# Patient Record
Sex: Female | Born: 1985 | Race: Black or African American | Hispanic: No | Marital: Single | Smoking: Never smoker
Health system: Southern US, Community
[De-identification: ages and names within clinical notes are randomized; demographics above are authoritative.]

## PROBLEM LIST (undated history)

## (undated) DIAGNOSIS — G43909 Migraine, unspecified, not intractable, without status migrainosus: Secondary | ICD-10-CM

---
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
## 2005-01-17 ENCOUNTER — Ambulatory Visit (HOSPITAL_COMMUNITY): Admission: RE | Admit: 2005-01-17 | Discharge: 2005-01-17 | Payer: Self-pay | Admitting: *Deleted

## 2005-05-29 ENCOUNTER — Inpatient Hospital Stay (HOSPITAL_COMMUNITY): Admission: AD | Admit: 2005-05-29 | Discharge: 2005-05-31 | Payer: Self-pay | Admitting: Gynecology

## 2005-05-29 ENCOUNTER — Ambulatory Visit: Payer: Self-pay | Admitting: Obstetrics and Gynecology

## 2006-12-15 IMAGING — US US OB COMP +14 WK
1 series · 13 of 28 positions shown · non-contrast
Comparison: none

CLINICAL DATA: 20 week 0 day assigned gestational age.  Evaluate fetal anatomy and growth.

[Series 1: us ob comp +14 wk · 0.27mm/px · 13 of 90 slices shown]
[im 4/90]
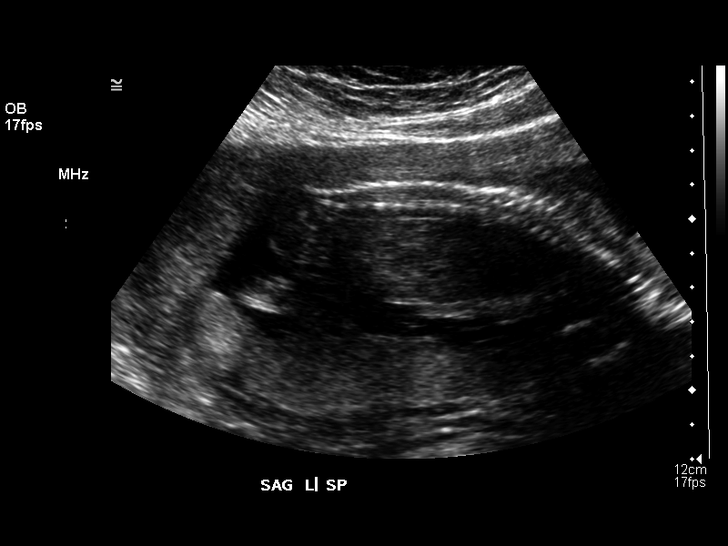
[im 10/90]
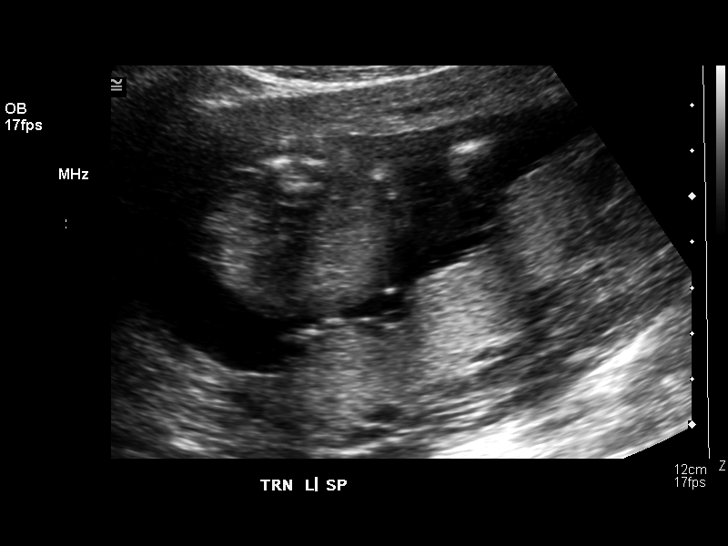
[im 17/90]
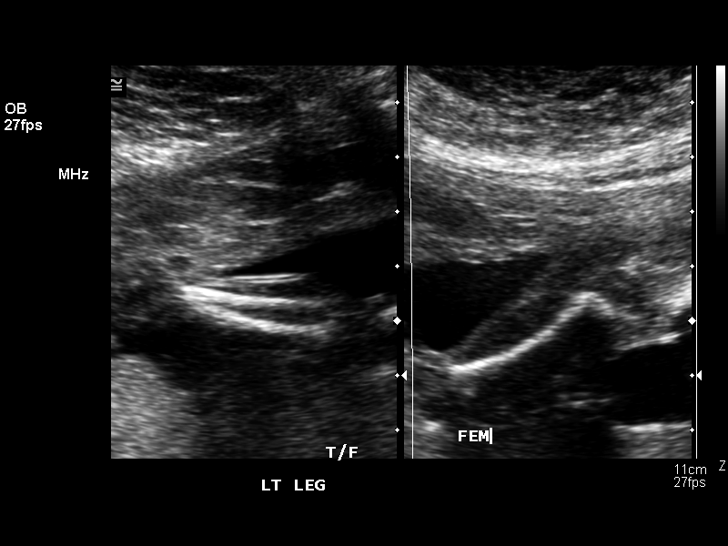
[im 24/90]
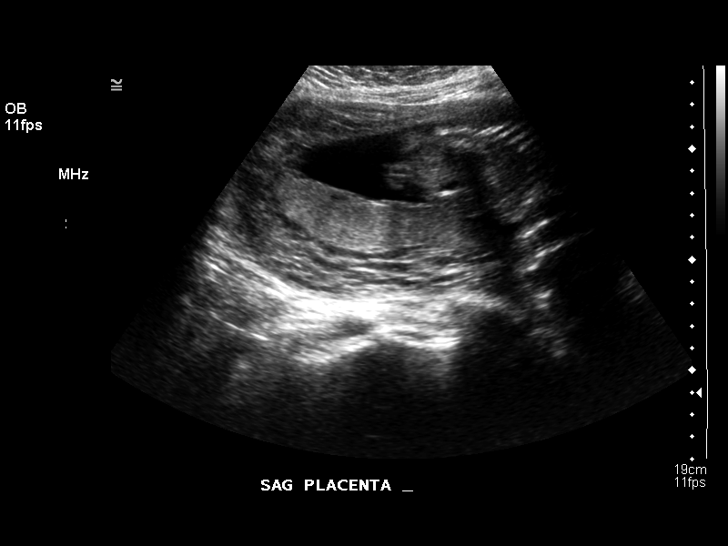
[im 30/90]
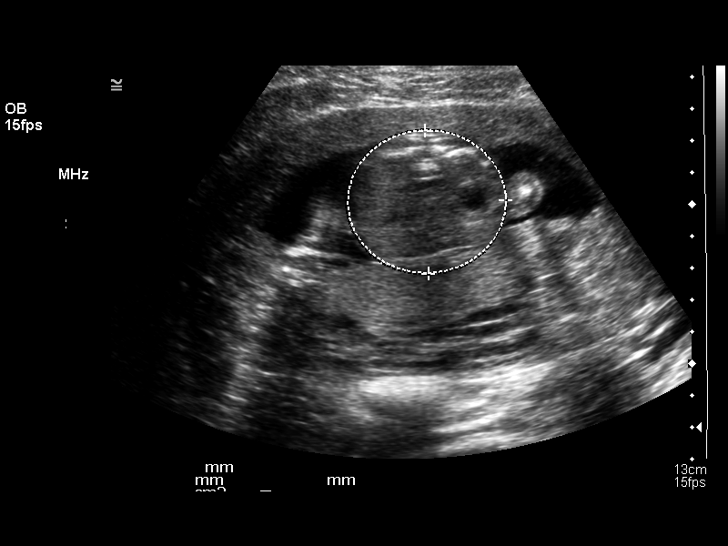
[im 37/90]
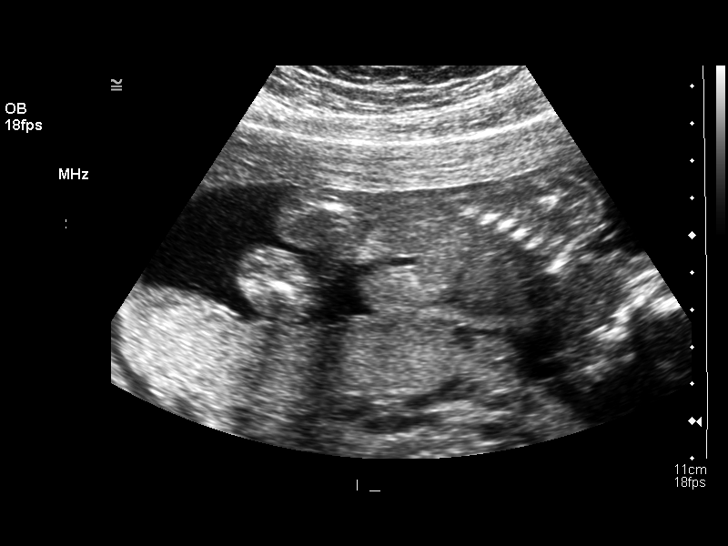
[im 47/90]
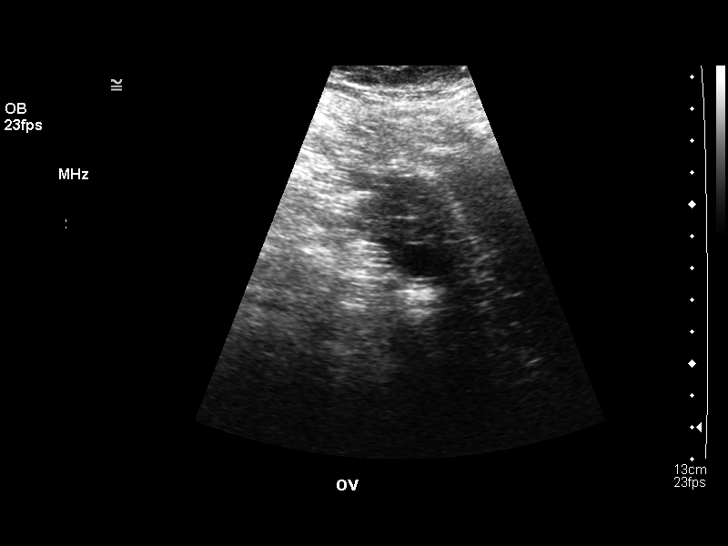
[im 53/90]
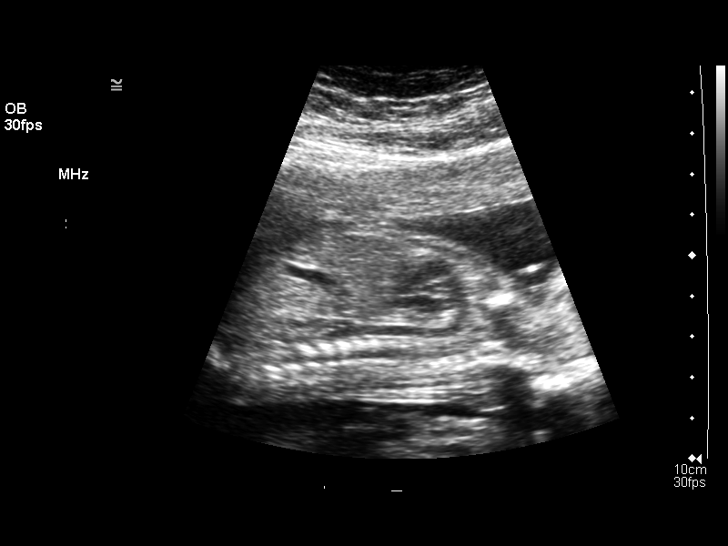
[im 60/90]
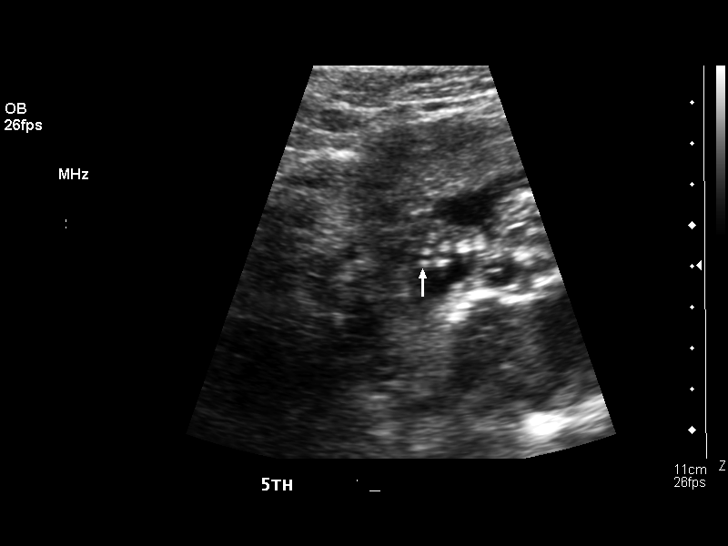
[im 66/90]
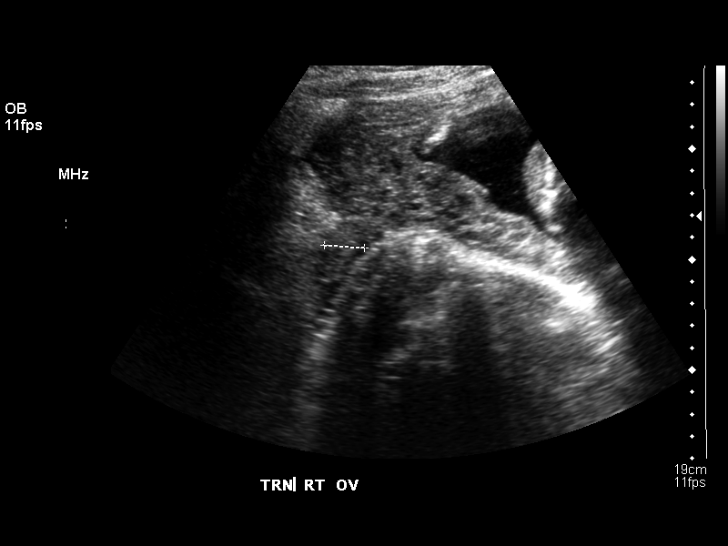
[im 73/90]
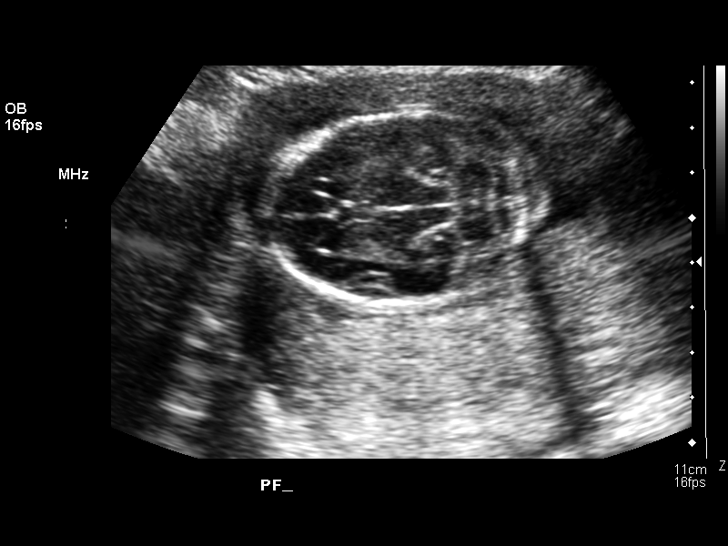
[im 80/90]
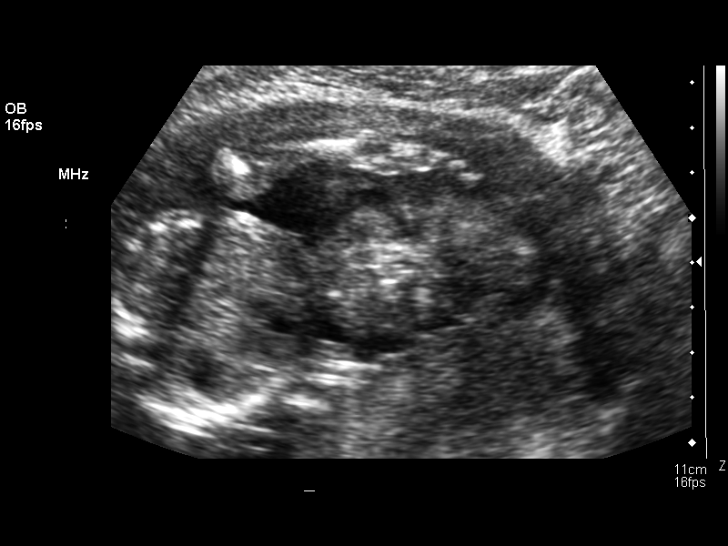
[im 86/90]
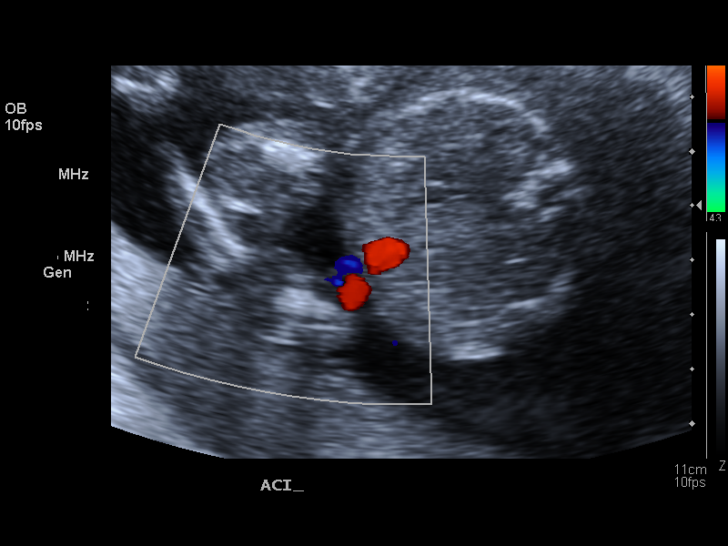

[13 of 28 positions shown; findings below may reference images not displayed]

OBSTETRICAL ULTRASOUND:
Number of Fetuses:  1
Heart Rate:  133 bpm
Movement:  Yes
Breathing:  No
Presentation:  Cephalic
Placental Location:  Posterior
Grade:  1
Previa:  No
Amniotic Fluid (Subjective):  Normal
Amniotic Fluid (Objective):   Vertical pocket 4.8 cm

FETAL BIOMETRY
BPD:  4.3 cm  19 w  0 d
HC:  17.1 cm  19 w 5 d
AC:  14.7 cm  20 w  0 d
FL:  3.4 cm  20 w 5 d

MEAN GA:   19 w  6 d

FETAL ANATOMY
Lateral Ventricles:  Visualized 
Thalami/CSP:  Visualized 
Posterior Fossa:  Visualized 
Nuchal Region:  Visualized 
Spine:  Visualized 
4 Chamber Heart on Left:  Visualized 
Stomach on Left:  Visualized 
3 Vessel Cord:  Visualized 
Cord Insertion site:  Visualized 
Kidneys:  Visualized 
Bladder:  Visualized 
Extremities:  Visualized 

ADDITIONAL ANATOMY VISUALIZED:  LVOT, RVOT, upper lip, orbits, diaphragm, heel, 5th digit, ductal arch, aortic arch

Evaluation limited by:  Fetal position

MATERNAL UTERINE AND ADNEXAL FINDINGS
Cervix:   3.1 cm transabdominally

Both ovaries are unremarkable.
IMPRESSION: 1.  Assigned gestational age is currently 20 weeks 0 days.  Appropriate fetal growth.

2.  No evidence of fetal anatomic abnormality.

## 2007-02-25 ENCOUNTER — Emergency Department (HOSPITAL_COMMUNITY): Admission: EM | Admit: 2007-02-25 | Discharge: 2007-02-25 | Payer: Self-pay | Admitting: Emergency Medicine

## 2008-01-04 ENCOUNTER — Emergency Department (HOSPITAL_COMMUNITY): Admission: EM | Admit: 2008-01-04 | Discharge: 2008-01-04 | Payer: Self-pay | Admitting: Family Medicine

## 2010-11-14 LAB — GC/CHLAMYDIA PROBE AMP, GENITAL
Chlamydia, DNA Probe: NEGATIVE
GC Probe Amp, Genital: NEGATIVE

## 2010-11-14 LAB — RPR: RPR Ser Ql: NONREACTIVE

## 2010-11-14 LAB — WET PREP, GENITAL
Trich, Wet Prep: NONE SEEN
Yeast Wet Prep HPF POC: NONE SEEN

## 2010-11-14 LAB — PREGNANCY, URINE: Preg Test, Ur: NEGATIVE

## 2010-11-14 LAB — URIC ACID: Uric Acid, Serum: 4.2

## 2011-03-03 ENCOUNTER — Emergency Department (HOSPITAL_COMMUNITY)
Admission: EM | Admit: 2011-03-03 | Discharge: 2011-03-03 | Disposition: A | Discharge status: Home / Self Care | Payer: Self-pay | Attending: Emergency Medicine | Admitting: Emergency Medicine

## 2011-03-03 ENCOUNTER — Encounter: Payer: Self-pay | Admitting: Emergency Medicine

## 2011-03-03 DIAGNOSIS — B9689 Other specified bacterial agents as the cause of diseases classified elsewhere: Secondary | ICD-10-CM | POA: Insufficient documentation

## 2011-03-03 DIAGNOSIS — R10813 Right lower quadrant abdominal tenderness: Secondary | ICD-10-CM | POA: Insufficient documentation

## 2011-03-03 DIAGNOSIS — N390 Urinary tract infection, site not specified: Secondary | ICD-10-CM | POA: Insufficient documentation

## 2011-03-03 DIAGNOSIS — Z79899 Other long term (current) drug therapy: Secondary | ICD-10-CM | POA: Insufficient documentation

## 2011-03-03 DIAGNOSIS — A499 Bacterial infection, unspecified: Secondary | ICD-10-CM | POA: Insufficient documentation

## 2011-03-03 DIAGNOSIS — M79609 Pain in unspecified limb: Secondary | ICD-10-CM | POA: Insufficient documentation

## 2011-03-03 DIAGNOSIS — N760 Acute vaginitis: Secondary | ICD-10-CM | POA: Insufficient documentation

## 2011-03-03 DIAGNOSIS — R1031 Right lower quadrant pain: Secondary | ICD-10-CM | POA: Insufficient documentation

## 2011-03-03 LAB — URINALYSIS, ROUTINE W REFLEX MICROSCOPIC
Glucose, UA: NEGATIVE mg/dL
Hgb urine dipstick: NEGATIVE
Ketones, ur: NEGATIVE mg/dL
Nitrite: POSITIVE — AB
Protein, ur: NEGATIVE mg/dL
Specific Gravity, Urine: 1.034 — ABNORMAL HIGH (ref 1.005–1.030)
Urobilinogen, UA: 1 mg/dL (ref 0.0–1.0)

## 2011-03-03 LAB — POCT PREGNANCY, URINE: Preg Test, Ur: NEGATIVE

## 2011-03-03 LAB — WET PREP, GENITAL: Yeast Wet Prep HPF POC: NONE SEEN

## 2011-03-03 LAB — URINE MICROSCOPIC-ADD ON

## 2011-03-03 MED ORDER — CIPROFLOXACIN HCL 500 MG PO TABS: 500.0000 mg | ORAL_TABLET | Freq: Two times a day (BID) | ORAL | Status: AC

## 2011-03-03 MED ORDER — METRONIDAZOLE 500 MG PO TABS: 500.0000 mg | ORAL_TABLET | Freq: Two times a day (BID) | ORAL | Status: AC

## 2011-03-03 MED ORDER — OXYCODONE-ACETAMINOPHEN 5-325 MG PO TABS
1.0000 | ORAL_TABLET | Freq: Once | ORAL | Status: AC
  Administered 2011-03-03: 1 via ORAL

## 2011-03-03 MED ORDER — OXYCODONE-ACETAMINOPHEN 5-325 MG PO TABS: 2.0000 | ORAL_TABLET | ORAL | Status: AC | PRN

## 2011-03-03 NOTE — ED Notes (Signed)
Pt c/o right sided groin area pain x 7 months that started radiating down right leg several weeks ago; pt sts some pain in left groin also

## 2011-03-03 NOTE — ED Notes (Signed)
Pt states that she has had rt side lower abd pain x 6-7 months and for the last week or so pain has raditaed down her leg hurts worse at night and durning the day the pain down her leg gets better but she still feels pain in abd denies vag d/c dysuira n/v/ or ovarian cysts

## 2011-03-03 NOTE — ED Provider Notes (Signed)
History     CSN: [id number]  Arrival date & time 03/03/11  0945   First MD Initiated Contact with Patient 03/03/11 1025      Chief Complaint  Patient presents with  . Abdominal Pain    (Consider location/radiation/quality/duration/timing/severity/associated sxs/prior treatment) HPI  Pt presents to the ED with complaints of RLQ abdominal pain for 6 months that has been constant. It has also been associated with intermittent right leg pains that are shooting in quality. The patient denies anything making the pain better or worse. Tylenol and Ibuprofen do not help, that pain doesn't change with position of movement. The patient has a Mirena in place for birth control. The patient denies urinary/vaginal symptoms, fevers, diarrhea, constipation, vomiting.  History reviewed. No pertinent past medical history.  History reviewed. No pertinent past surgical history.  History reviewed. No pertinent family history.  History  Substance Use Topics  . Smoking status: Never Smoker   . Smokeless tobacco: Not on file  . Alcohol Use: No    OB History    Grav Para Term Preterm Abortions TAB SAB Ect Mult Living                  Review of Systems  All other systems reviewed and are negative.    Allergies  Review of patient's allergies indicates no known allergies.  Home Medications   Current Outpatient Rx  Name Route Sig Dispense Refill  . RANITIDINE HCL 300 MG PO TABS Oral Take 300 mg by mouth at bedtime.      Marland Kitchen CIPROFLOXACIN HCL 500 MG PO TABS Oral Take 1 tablet (500 mg total) by mouth 2 (two) times daily. 28 tablet 0  . METRONIDAZOLE 500 MG PO TABS Oral Take 1 tablet (500 mg total) by mouth 2 (two) times daily. 14 tablet 0  . OXYCODONE-ACETAMINOPHEN 5-325 MG PO TABS Oral Take 2 tablets by mouth every 4 (four) hours as needed for pain. 6 tablet 0    BP 118/64  Pulse 79  Temp(Src) 98.2 F (36.8 C) (Oral)  Resp 18  SpO2 99%  Physical Exam  Nursing note and vitals  reviewed. Constitutional: She is oriented to person, place, and time. She appears well-developed and well-nourished.  HENT:  Head: Normocephalic and atraumatic.  Eyes: Conjunctivae are normal. Pupils are equal, round, and reactive to light.  Neck: Trachea normal, normal range of motion and full passive range of motion without pain. Neck supple.  Cardiovascular: Normal rate, regular rhythm and normal pulses.   Pulmonary/Chest: Effort normal and breath sounds normal. Chest wall is not dull to percussion. She exhibits no tenderness, no crepitus, no edema, no deformity and no retraction.  Abdominal: Soft. Normal appearance and bowel sounds are normal. She exhibits no distension and no mass. There is tenderness (RLQ tenderness to palpation). There is no rebound and no guarding.  Genitourinary: Uterus normal. Cervix exhibits no motion tenderness, no discharge and no friability. Right adnexum displays no mass, no tenderness and no fullness. Left adnexum displays no mass, no tenderness and no fullness.  Musculoskeletal: Normal range of motion.  Lymphadenopathy:       Head (right side): No submental, no submandibular, no tonsillar, no preauricular, no posterior auricular and no occipital adenopathy present.       Head (left side): No submental, no submandibular, no tonsillar, no preauricular, no posterior auricular and no occipital adenopathy present.    She has no cervical adenopathy.    She has no axillary adenopathy.  Neurological: She  is oriented to person, place, and time. She has normal strength.  Skin: Skin is warm, dry and intact.  Psychiatric: Her speech is normal. Cognition and memory are normal.    ED Course  Procedures (including critical care time)  Labs Reviewed  URINALYSIS, ROUTINE W REFLEX MICROSCOPIC - Abnormal; Notable for the following:    Specific Gravity, Urine 1.034 (*)    Nitrite POSITIVE (*)    Leukocytes, UA SMALL (*)    All other components within normal limits  WET  PREP, GENITAL - Abnormal; Notable for the following:    Trich, Wet Prep FEW (*)    Clue Cells, Wet Prep FEW (*)    WBC, Wet Prep HPF POC FEW (*)    All other components within normal limits  GC/CHLAMYDIA PROBE AMP, GENITAL - Abnormal; Notable for the following:    Chlamydia, DNA Probe POSITIVE (*)    All other components within normal limits  URINE MICROSCOPIC-ADD ON - Abnormal; Notable for the following:    Bacteria, UA MANY (*)    All other components within normal limits  POCT PREGNANCY, URINE  LAB REPORT - SCANNED  POCT PREGNANCY, URINE   No results found.   1. UTI (lower urinary tract infection)   2. Bacterial vaginosis       MDM          Dorthula Matas, PA 03/06/11 [phone]

## 2011-03-03 NOTE — ED Notes (Signed)
BRP

## 2011-03-05 LAB — GC/CHLAMYDIA PROBE AMP, GENITAL
Chlamydia, DNA Probe: POSITIVE — AB
GC Probe Amp, Genital: NEGATIVE

## 2011-03-06 NOTE — ED Notes (Addendum)
+   Chlamydia -Patient treated with Cipro and  Flagyl-chart sent to EDP  Office for review.

## 2011-03-07 NOTE — ED Notes (Signed)
Please prescribe Doxycyline 100 mg tab po BID x 7 days per Fayrene Helper.

## 2011-03-07 NOTE — ED Provider Notes (Signed)
Medical screening examination/treatment/procedure(s) were performed by non-physician practitioner and as supervising physician I was immediately available for consultation/collaboration.   Gerhard Munch, MD 03/07/11 [phone]

## 2011-03-11 NOTE — ED Notes (Signed)
Unable to contact via phone letter sent to EDP office for review.

## 2011-04-08 NOTE — ED Notes (Signed)
No response after 30 days . Chart appended and sent to Medical records. 

## 2011-04-16 NOTE — ED Notes (Signed)
Patient called and rx called to [facility] on [address].

## 2011-09-07 ENCOUNTER — Emergency Department (HOSPITAL_COMMUNITY)
Admission: EM | Admit: 2011-09-07 | Discharge: 2011-09-07 | Disposition: A | Discharge status: Home / Self Care | Payer: Self-pay | Attending: Emergency Medicine | Admitting: Emergency Medicine

## 2011-09-07 ENCOUNTER — Encounter (HOSPITAL_COMMUNITY): Payer: Self-pay | Admitting: Emergency Medicine

## 2011-09-07 DIAGNOSIS — L0291 Cutaneous abscess, unspecified: Secondary | ICD-10-CM

## 2011-09-07 DIAGNOSIS — L0231 Cutaneous abscess of buttock: Secondary | ICD-10-CM | POA: Insufficient documentation

## 2011-09-07 MED ORDER — SULFAMETHOXAZOLE-TRIMETHOPRIM 800-160 MG PO TABS: 1.0000 | ORAL_TABLET | Freq: Two times a day (BID) | ORAL | Status: AC

## 2011-09-07 NOTE — ED Provider Notes (Signed)
History     CSN: [id number]  Arrival date & time 09/07/11  1919   First MD Initiated Contact with Patient 09/07/11 2010      Chief Complaint  Patient presents with  . Insect Bite    (Consider location/radiation/quality/duration/timing/severity/associated sxs/prior treatment) HPI History from patient. 26 year old female presents with possible insect bite to her left buttock. She states that she was at work on Thursday when she felt as if she got bitten by something on her buttock. She gradually noticed increased pain to the area and spreading redness. She has not noticed any drainage to the same. She has no history of abscesses. Denies fever or chills. She took Tylenol without relief of her pain at home prior to coming.  History reviewed. No pertinent past medical history.  History reviewed. No pertinent past surgical history.  History reviewed. No pertinent family history.  History  Substance Use Topics  . Smoking status: Never Smoker   . Smokeless tobacco: Not on file  . Alcohol Use: No    OB History    Grav Para Term Preterm Abortions TAB SAB Ect Mult Living                  Review of Systems as per history of present illness  Allergies  Review of patient's allergies indicates no known allergies.  Home Medications   Current Outpatient Rx  Name Route Sig Dispense Refill  . ACETAMINOPHEN 500 MG PO TABS Oral Take 500 mg by mouth every 4 (four) hours as needed. For pain      BP 124/86  Pulse 86  Temp 98.3 F (36.8 C) (Oral)  Resp 18  SpO2 98%  Physical Exam  Nursing note and vitals reviewed. Constitutional: She appears well-developed and well-nourished. No distress.  HENT:  Head: Normocephalic and atraumatic.  Neck: Normal range of motion.  Cardiovascular: Normal rate.   Pulmonary/Chest: Effort normal.  Musculoskeletal: Normal range of motion.  Neurological: She is alert.  Skin: Skin is warm and dry. She is not diaphoretic.       4-5 cm of induration  and mild erythema to left buttock. There is a center area with pointing. No visible drainage at this time.  Psychiatric: She has a normal mood and affect.    ED Course  Procedures (including critical care time)  INCISION AND DRAINAGE Performed by: Grant Fontana Consent: Verbal consent obtained. Risks and benefits: risks, benefits and alternatives were discussed Type: abscess  Body area: Left buttock  Anesthesia: local infiltration  Local anesthetic: lidocaine 2 % with epinephrine  Anesthetic total: 3 ml  Complexity: complex Blunt dissection to break up loculations  Drainage: purulent  Drainage amount: Mild, bloody   Packing material: 1/4 in iodoform gauze  Patient tolerance: Patient tolerated the procedure well with no immediate complications.     Labs Reviewed - No data to display No results found.   1. Cellulitis and abscess       MDM  Patient presents with possible bite to her buttock. This area appears mildly cellulitic with a central area of induration and pointing. Area was incised and drained which was productive primarily of bloody drainage. Patient will be started on Septra to treat cellulitis. Instructed on wound care. Reasons to return discussed. Patient verbalized understanding and agreed to plan.       Grant Fontana, PA-C 09/08/11 0300  Grant Fontana, PA-C 09/08/11 0302

## 2011-09-07 NOTE — ED Notes (Signed)
Patient complaining of spider bite on left buttocks that appears on Thursday; patient reports pain at site; denies itchiness.  Abscess like mark noted on left buttocks; redness around site.  No drainage noted.

## 2011-09-08 NOTE — ED Provider Notes (Signed)
Medical screening examination/treatment/procedure(s) were performed by non-physician practitioner and as supervising physician I was immediately available for consultation/collaboration.  Tylea Hise L Briyana Badman, MD 09/08/11 1827 

## 2012-11-08 ENCOUNTER — Encounter (HOSPITAL_COMMUNITY): Payer: Self-pay | Admitting: *Deleted

## 2012-11-08 ENCOUNTER — Emergency Department (HOSPITAL_COMMUNITY)
Admission: EM | Admit: 2012-11-08 | Discharge: 2012-11-08 | Disposition: A | Discharge status: Home / Self Care | Payer: Medicaid Other | Attending: Emergency Medicine | Admitting: Emergency Medicine

## 2012-11-08 ENCOUNTER — Emergency Department (HOSPITAL_COMMUNITY): Payer: Medicaid Other

## 2012-11-08 DIAGNOSIS — B9689 Other specified bacterial agents as the cause of diseases classified elsewhere: Secondary | ICD-10-CM

## 2012-11-08 DIAGNOSIS — N760 Acute vaginitis: Secondary | ICD-10-CM | POA: Insufficient documentation

## 2012-11-08 DIAGNOSIS — Z3202 Encounter for pregnancy test, result negative: Secondary | ICD-10-CM | POA: Insufficient documentation

## 2012-11-08 DIAGNOSIS — R350 Frequency of micturition: Secondary | ICD-10-CM | POA: Insufficient documentation

## 2012-11-08 LAB — URINALYSIS, ROUTINE W REFLEX MICROSCOPIC
Glucose, UA: NEGATIVE mg/dL
Hgb urine dipstick: NEGATIVE
Ketones, ur: NEGATIVE mg/dL
Nitrite: NEGATIVE
Protein, ur: NEGATIVE mg/dL
Specific Gravity, Urine: 1.038 — ABNORMAL HIGH (ref 1.005–1.030)
Urobilinogen, UA: 1 mg/dL (ref 0.0–1.0)
pH: 5.5 (ref 5.0–8.0)

## 2012-11-08 LAB — WET PREP, GENITAL

## 2012-11-08 LAB — URINE MICROSCOPIC-ADD ON

## 2012-11-08 LAB — PREGNANCY, URINE: Preg Test, Ur: NEGATIVE

## 2012-11-08 MED ORDER — METRONIDAZOLE 500 MG PO TABS: 500.0000 mg | ORAL_TABLET | Freq: Two times a day (BID) | ORAL | Status: DC

## 2012-11-08 NOTE — ED Provider Notes (Signed)
CSN: [id number]     Arrival date & time 11/08/12  0807 History   First MD Initiated Contact with Patient 11/08/12 [phone]     Chief Complaint  Patient presents with  . Abdominal Pain    HPI Pt was seen at 0840.  Per pt, c/o gradual onset and persistence of intermittent right pelvic "pain" for the past 3 months.  Has been associated with dysuria and urinary frequency.  Describes the abd pain as "sharp."  States she was due to have her IUD removed 2 months ago but has not been able to get an appt with her OB/GYN ofc. Denies N/V/D, no hematuria, no vaginal bleeding/discharge, no fevers, no back pain, no rash, no CP/SOB, no black or blood in stools.     [facility] History reviewed. No pertinent past medical history.  History reviewed. No pertinent past surgical history.  History  Substance Use Topics  . Smoking status: Never Smoker   . Smokeless tobacco: Not on file  . Alcohol Use: No    Review of Systems ROS: Statement: All systems negative except as marked or noted in the HPI; Constitutional: Negative for fever and chills. ; ; Eyes: Negative for eye pain, redness and discharge. ; ; ENMT: Negative for ear pain, hoarseness, nasal congestion, sinus pressure and sore throat. ; ; Cardiovascular: Negative for chest pain, palpitations, diaphoresis, dyspnea and peripheral edema. ; ; Respiratory: Negative for cough, wheezing and stridor. ; ; Gastrointestinal: Negative for nausea, vomiting, diarrhea, abdominal pain, blood in stool, hematemesis, jaundice and rectal bleeding. . ; ; Genitourinary: +dysuria, urinary frequency. Negative for flank pain and hematuria. ; ; GYN:  +pelvic pain. No vaginal bleeding, no vaginal discharge, no vulvar pain.;; Musculoskeletal: Negative for back pain and neck pain. Negative for swelling and trauma.; ; Skin: Negative for pruritus, rash, abrasions, blisters, bruising and skin lesion.; ; Neuro: Negative for headache, lightheadedness and neck stiffness. Negative for  weakness, altered level of consciousness , altered mental status, extremity weakness, paresthesias, involuntary movement, seizure and syncope.       Allergies  Review of patient's allergies indicates no known allergies.  Home Medications   No current outpatient prescriptions on file. BP 94/58  Pulse 84  Temp(Src) 98.5 F (36.9 C) (Oral)  Resp 16  SpO2 98%  LMP 09/26/2012 Physical Exam 0845: Physical examination:  Nursing notes reviewed; Vital signs and O2 SAT reviewed;  Constitutional: Well developed, Well nourished, Well hydrated, In no acute distress; Head:  Normocephalic, atraumatic; Eyes: EOMI, PERRL, No scleral icterus; ENMT: Mouth and pharynx normal, Mucous membranes moist; Neck: Supple, Full range of motion, No lymphadenopathy; Cardiovascular: Regular rate and rhythm, No murmur, rub, or gallop; Respiratory: Breath sounds clear & equal bilaterally, No rales, rhonchi, wheezes.  Speaking full sentences with ease, Normal respiratory effort/excursion; Chest: Nontender, Movement normal; Abdomen: Soft, Nontender. No rebound or guarding. Nondistended, Normal bowel sounds; Genitourinary: No CVA tenderness. Pelvic exam performed with permission of pt and female ED tech assist during exam.  External genitalia w/o lesions. Vaginal vault with scant amount of thick white discharge.  Cervix w/o lesions, not friable, IUD strings through os are visualized. GC/chlam and wet prep obtained and sent to lab.  Bimanual exam w/o CMT, uterine or adnexal tenderness.;;; Extremities: Pulses normal, No tenderness, No edema, No calf edema or asymmetry.; Neuro: AA&Ox3, Major CN grossly intact.  Speech clear. Climbs on and off stretcher easily by herself. Gait steady. No gross focal motor or sensory deficits in extremities.; Skin: Color normal, Warm,  Dry.   ED Course  Procedures     MDM  MDM Reviewed: previous chart, nursing note and vitals Reviewed previous: labs Interpretation: labs and  ultrasound   Results for orders placed during the hospital encounter of 11/08/12  WET PREP, GENITAL      Result Value Range   Yeast Wet Prep HPF POC NONE SEEN  NONE SEEN   Trich, Wet Prep NONE SEEN  NONE SEEN   Clue Cells Wet Prep HPF POC FEW (*) NONE SEEN   WBC, Wet Prep HPF POC FEW (*) NONE SEEN  URINALYSIS, ROUTINE W REFLEX MICROSCOPIC      Result Value Range   Color, Urine AMBER (*) YELLOW   APPearance CLOUDY (*) CLEAR   Specific Gravity, Urine 1.038 (*) 1.005 - 1.030   pH 5.5  5.0 - 8.0   Glucose, UA NEGATIVE  NEGATIVE mg/dL   Hgb urine dipstick NEGATIVE  NEGATIVE   Bilirubin Urine SMALL (*) NEGATIVE   Ketones, ur NEGATIVE  NEGATIVE mg/dL   Protein, ur NEGATIVE  NEGATIVE mg/dL   Urobilinogen, UA 1.0  0.0 - 1.0 mg/dL   Nitrite NEGATIVE  NEGATIVE   Leukocytes, UA SMALL (*) NEGATIVE  PREGNANCY, URINE      Result Value Range   Preg Test, Ur NEGATIVE  NEGATIVE  URINE MICROSCOPIC-ADD ON      Result Value Range   Squamous Epithelial / LPF FEW (*) RARE   WBC, UA 3-6  <3 WBC/hpf   Bacteria, UA FEW (*) RARE   Urine-Other MUCOUS PRESENT     [place] Art/ven Flow Abd Pelv Doppler 11/08/2012   *RADIOLOGY REPORT*  Clinical Data:  Pelvic pain.  IUD in place.  TRANSABDOMINAL AND TRANSVAGINAL ULTRASOUND OF PELVIS DOPPLER ULTRASOUND OF OVARIES  Technique:  Transabdominal and transvaginal ultrasound examination of the pelvis was performed including evaluation of the uterus, ovaries, adnexal regions, and pelvic cul-de-sac.  Color and duplex Doppler ultrasound was utilized to evaluate blood flow to the ovaries.  Comparison:  Ovaries sonogram 01/17/2005.  No comparison pelvic sonogram.  Findings:  Uterus:  Questionable sub centimeter fibroid posterior body level. Uterus measures 9.2 x 5.4 x 5.3 cm.  Endometrium:  IUD in place.  Slightly heterogeneous appearance of endometrial contents.  Minimal amount of fluid lower uterine segment.  Right ovary:  4.0 x 1.8 x 3.2 cm.  No dominant worrisome lesion.  Left  ovary:  Per ultrasound technologist, difficult to visualize posterior to the uterus.  Left ovary measures 3.5 x 2.1 x 1.6 cm. No dominant worrisome mass.  Pulsed Doppler evaluation demonstrates normal low-resistance arterial and venous waveforms in both ovaries.  IMPRESSION: Questionable sub centimeter fibroid posterior uterine body level.  IUD is in place with minimal amount of fluid lower uterine segment.  No dominant worrisome adnexal mass.  Left ovary was somewhat difficult to visualize.  No sonographic evidence for ovarian torsion.   Original Report Authenticated By: Lacy Duverney, M.D.    1130:  +BV, will treat. No clear UTI on Udip; UC pending. Doubt appy with 3 months of subjective pain and pt non-toxic appearing, abd benign on initial and re-exam. Pt wants to go home now. Encouraged to f/u with OB/GYN MD for IUD removal. Dx and testing d/w pt.  Questions answered.  Verb understanding, agreeable to d/c home with outpt f/u.       Laray Anger, DO 11/10/12 2341

## 2012-11-08 NOTE — Progress Notes (Signed)
[facility] provided pt with a [facility] [employer] Card application and a list of primary care resources.  °

## 2012-11-08 NOTE — ED Notes (Signed)
Pt states she has IUD and was due to be removed 2 months ago, when pain started

## 2012-11-08 NOTE — ED Notes (Signed)
Pt reports sharp, intermittent RLQ abd pain x2 months. Pain decreases with movement, worse at night when laying down. Denies n/v/d or constipation. States she thinks she may have a bladder infection. Denies pain with urination, but states it feels "uncomfortable" after urinating. Urine is darker than usual, and has been urinating more frequently.

## 2012-11-09 LAB — GC/CHLAMYDIA PROBE AMP
CT Probe RNA: NEGATIVE
GC Probe RNA: NEGATIVE

## 2012-11-09 LAB — URINE CULTURE: Colony Count: NO GROWTH

## 2013-10-13 ENCOUNTER — Encounter (HOSPITAL_COMMUNITY): Payer: Self-pay | Admitting: Emergency Medicine

## 2013-10-13 ENCOUNTER — Emergency Department (HOSPITAL_COMMUNITY)
Admission: EM | Admit: 2013-10-13 | Discharge: 2013-10-13 | Disposition: A | Discharge status: Home / Self Care | Payer: Medicaid Other | Attending: Emergency Medicine | Admitting: Emergency Medicine

## 2013-10-13 DIAGNOSIS — G4489 Other headache syndrome: Secondary | ICD-10-CM | POA: Diagnosis not present

## 2013-10-13 DIAGNOSIS — H538 Other visual disturbances: Secondary | ICD-10-CM | POA: Diagnosis present

## 2013-10-13 HISTORY — DX: Migraine, unspecified, not intractable, without status migrainosus: G43.909

## 2013-10-13 MED ORDER — DIPHENHYDRAMINE HCL 50 MG/ML IJ SOLN
25.0000 mg | Freq: Once | INTRAMUSCULAR | Status: AC
  Administered 2013-10-13: 25 mg via INTRAMUSCULAR
  Filled 2013-10-13: qty 1

## 2013-10-13 MED ORDER — BUTALBITAL-APAP-CAFFEINE 50-325-40 MG PO TABS: 1.0000 | ORAL_TABLET | Freq: Four times a day (QID) | ORAL | Status: DC | PRN

## 2013-10-13 MED ORDER — METOCLOPRAMIDE HCL 5 MG/ML IJ SOLN
10.0000 mg | Freq: Once | INTRAMUSCULAR | Status: AC
  Administered 2013-10-13: 10 mg via INTRAMUSCULAR
  Filled 2013-10-13: qty 2

## 2013-10-13 MED ORDER — MORPHINE SULFATE 4 MG/ML IJ SOLN
4.0000 mg | Freq: Once | INTRAMUSCULAR | Status: AC
Start: 2013-10-13 — End: 2013-10-13
  Administered 2013-10-13: 4 mg via INTRAMUSCULAR
  Filled 2013-10-13: qty 1

## 2013-10-13 NOTE — ED Notes (Addendum)
Initial contact - pt resting on stretcher with family at bedside, pt reports migraine HA x3 days with blurry vision to R eye today.  Pt reports hx migraines in the past.  +photophobia.  Neuros grossly intact.  Skin PWD.  MAEI.  NAD.

## 2013-10-13 NOTE — ED Notes (Signed)
Pt c/o migraine x 3 days and blurred vision in R eye starting this afternoon.  Pain score 9/10.  Pt reports taking OTC medications w/o relief.  Hx of migraines.

## 2013-10-13 NOTE — ED Provider Notes (Signed)
CSN: [id number]     Arrival date & time 10/13/13  1512 History   First MD Initiated Contact with Patient 10/13/13 1542     Chief Complaint  Patient presents with  . Migraine  . Blurred Vision     (Consider location/radiation/quality/duration/timing/severity/associated sxs/prior Treatment) HPI Comments: Pt notes photophobia and phonophobia--denies fever, vomiting, or focal weakness, no h/o trauma--used otc without relief--pain localized to right base of skull with radiation to her neck--nothing makes her sx better  Patient is a 28 y.o. female presenting with migraines. The history is provided by the patient.  Migraine This is a recurrent problem. The current episode started more than 2 days ago. The problem occurs constantly. The problem has not changed since onset.Associated symptoms include headaches. Pertinent negatives include no shortness of breath.    Past Medical History  Diagnosis Date  . Migraines    History reviewed. No pertinent past surgical history. History reviewed. No pertinent family history. History  Substance Use Topics  . Smoking status: Never Smoker   . Smokeless tobacco: Not on file  . Alcohol Use: Yes     Comment: occ   OB History   Grav Para Term Preterm Abortions TAB SAB Ect Mult Living                 Review of Systems  Respiratory: Negative for shortness of breath.   Neurological: Positive for headaches.  All other systems reviewed and are negative.     Allergies  Review of patient's allergies indicates no known allergies.  Home Medications   Prior to Admission medications   Medication Sig Start Date End Date Taking? Authorizing Provider  Aspirin-Salicylamide-Caffeine (BC HEADACHE POWDER PO) Take 1 packet by mouth as needed (headache migraines).   Yes Historical Provider, MD  ibuprofen (ADVIL,MOTRIN) 200 MG tablet Take 600 mg by mouth every 6 (six) hours as needed for headache.   Yes Historical Provider, MD  naproxen sodium (ANAPROX) 220  MG tablet Take 440 mg by mouth as needed (head ache).   Yes Historical Provider, MD   BP 131/67  Pulse 88  Temp(Src) 98 F (36.7 C) (Oral)  Resp 20  SpO2 99% Physical Exam  Nursing note and vitals reviewed. Constitutional: She is oriented to person, place, and time. She appears well-developed and well-nourished.  Non-toxic appearance. No distress.  HENT:  Head: Normocephalic and atraumatic.  Eyes: Conjunctivae, EOM and lids are normal. Pupils are equal, round, and reactive to light.  Neck: Normal range of motion. Neck supple. No tracheal deviation present. No mass present.  Cardiovascular: Normal rate, regular rhythm and normal heart sounds.  Exam reveals no gallop.   No murmur heard. Pulmonary/Chest: Effort normal and breath sounds normal. No stridor. No respiratory distress. She has no decreased breath sounds. She has no wheezes. She has no rhonchi. She has no rales.  Abdominal: Soft. Normal appearance and bowel sounds are normal. She exhibits no distension. There is no tenderness. There is no rebound and no CVA tenderness.  Musculoskeletal: Normal range of motion. She exhibits no edema and no tenderness.  Neurological: She is alert and oriented to person, place, and time. She has normal strength. No cranial nerve deficit or sensory deficit. GCS eye subscore is 4. GCS verbal subscore is 5. GCS motor subscore is 6.  Skin: Skin is warm and dry. No abrasion and no rash noted.  Psychiatric: She has a normal mood and affect. Her speech is normal and behavior is normal.    ED Course  Procedures (including critical care time) Labs Review Labs Reviewed - No data to display  Imaging Review No results found.   EKG Interpretation None      MDM   Final diagnoses:  None    Patient given migraine cocktail here feels better. Repeat neurological exam at time of discharge stable. No concern for subarachnoid hemorrhage or meningitis.    Toy Baker, MD 10/13/13 [phone]

## 2013-10-13 NOTE — Discharge Instructions (Signed)

## 2013-11-30 ENCOUNTER — Other Ambulatory Visit: Payer: Self-pay | Admitting: Urology

## 2013-11-30 DIAGNOSIS — R102 Pelvic and perineal pain: Secondary | ICD-10-CM

## 2013-12-01 ENCOUNTER — Ambulatory Visit
Admission: RE | Admit: 2013-12-01 | Discharge: 2013-12-01 | Disposition: A | Discharge status: Home / Self Care | Payer: Medicaid Other | Source: Ambulatory Visit | Attending: Urology | Admitting: Urology

## 2013-12-01 DIAGNOSIS — R102 Pelvic and perineal pain: Secondary | ICD-10-CM

## 2013-12-02 ENCOUNTER — Other Ambulatory Visit: Payer: Self-pay | Admitting: Urology

## 2013-12-02 ENCOUNTER — Ambulatory Visit
Admission: RE | Admit: 2013-12-02 | Discharge: 2013-12-02 | Disposition: A | Discharge status: Home / Self Care | Payer: Medicaid Other | Source: Ambulatory Visit | Attending: Urology | Admitting: Urology

## 2013-12-02 DIAGNOSIS — R102 Pelvic and perineal pain: Secondary | ICD-10-CM

## 2013-12-13 ENCOUNTER — Encounter: Payer: Medicaid Other | Admitting: Family Medicine

## 2013-12-18 ENCOUNTER — Emergency Department (HOSPITAL_COMMUNITY)
Admission: EM | Admit: 2013-12-18 | Discharge: 2013-12-18 | Disposition: A | Discharge status: Home / Self Care | Payer: Medicaid Other | Source: Home / Self Care | Attending: Emergency Medicine | Admitting: Emergency Medicine

## 2013-12-18 ENCOUNTER — Encounter (HOSPITAL_COMMUNITY): Payer: Self-pay | Admitting: Emergency Medicine

## 2013-12-18 DIAGNOSIS — J039 Acute tonsillitis, unspecified: Secondary | ICD-10-CM | POA: Diagnosis not present

## 2013-12-18 LAB — POCT RAPID STREP A: STREPTOCOCCUS, GROUP A SCREEN (DIRECT): NEGATIVE

## 2013-12-18 LAB — POCT INFECTIOUS MONO SCREEN: Mono Screen: NEGATIVE

## 2013-12-18 MED ORDER — AMOXICILLIN 500 MG PO CAPS: 500.0000 mg | ORAL_CAPSULE | Freq: Three times a day (TID) | ORAL | Status: DC

## 2013-12-18 MED ORDER — PREDNISONE 20 MG PO TABS: 20.0000 mg | ORAL_TABLET | Freq: Two times a day (BID) | ORAL | Status: DC

## 2013-12-18 NOTE — ED Provider Notes (Signed)
Chief Complaint   Sore Throat   History of Present Illness   Percell BeltChristian D Scalise is a 28 year old female who's had a 3 day history of sore throat, pain and burning when she swallows, enlarged tonsils with white spots, sneezing, nasal congestion, rhinorrhea, and nausea. She denies fevers, chills, headache, swollen glands, stiff neck, cough, vomiting, or diarrhea she has had no known strep exposure and no prior history of strep throat or tonsillitis.   Review of Systems   Other than as noted above, the patient denies any of the following symptoms. Systemic:  No fever, chills, sweats, myalgias, or headache. Eye:  No redness, pain or drainage. ENT:  No earache, nasal congestion, sneezing, rhinorrhea, sinus pressure, sinus pain, or post nasal drip. Lungs:  No cough, sputum production, wheezing, shortness of breath, or chest pain. GI:  No abdominal pain, nausea, vomiting, or diarrhea. Skin:  No rash.  PMFSH   Past medical history, family history, social history, meds, and allergies were reviewed.   Physical Exam     Vital signs:  BP 116/75  Pulse 79  Temp(Src) 98.8 F (37.1 C) (Oral)  Resp 16  SpO2 99% General:  Alert, in no distress. Phonation was normal, no drooling, and patient was able to handle secretions well.  Eye:  No conjunctival injection or drainage. Lids were normal. ENT:  TMs and canals were normal, without erythema or inflammation.  Nasal mucosa was clear and uncongested, without drainage.  Mucous membranes were moist.  Exam of pharynx reveals enlargement of the tonsils, erythema, and yellowish exudate.  There were no oral ulcerations or lesions. There was no bulging of the tonsillar pillars, and the uvula was midline. Neck:  Supple, no adenopathy, tenderness or mass. Lungs:  No respiratory distress.  Lungs were clear to auscultation, without wheezes, rales or rhonchi.  Breath sounds were clear and equal bilaterally.  Heart:  Regular rhythm, without gallops, murmers or  rubs. Skin:  Clear, warm, and dry, without rash or lesions.  Labs   Results for orders placed during the hospital encounter of 12/18/13  POCT RAPID STREP A ([facility] ONLY)      Result Value Ref Range   Streptococcus, Group A Screen (Direct) NEGATIVE  NEGATIVE  POCT INFECTIOUS MONO SCREEN      Result Value Ref Range   Mono Screen NEGATIVE  NEGATIVE    Assessment   The encounter diagnosis was Tonsillitis.  There is no evidence of a peritonsillar abscess, retropharyngeal abscess, or epiglottitis.    Plan     1.  Meds:  The following meds were prescribed:   Discharge Medication List as of 12/18/2013 10:15 AM    START taking these medications   Details  amoxicillin (AMOXIL) 500 MG capsule Take 1 capsule (500 mg total) by mouth 3 (three) times daily., Starting 12/18/2013, Until Discontinued, Normal    predniSONE (DELTASONE) 20 MG tablet Take 1 tablet (20 mg total) by mouth 2 (two) times daily., Starting 12/18/2013, Until Discontinued, Normal        2.  Patient Education/Counseling:  The patient was given appropriate handouts, self care instructions, and instructed in symptomatic relief, including hot saline gargles, throat lozenges, infectious precautions, and need to trade out toothbrush.    3.  Follow up:  The patient was told to follow up here if no better in 3 to 4 days, or sooner if becoming worse in any way, and given some red flag symptoms such as difficulty swallowing or breathing which would prompt  immediate return.      Reuben Likesavid C Melinda Pottinger, MD 12/18/13 1130

## 2013-12-18 NOTE — ED Notes (Addendum)
C/o sore throat body aches and sneezing onset 3 days ago. States her tonsils swelled up yesterday.  No fever, or cough. States she has white spots on her tonsils.

## 2013-12-18 NOTE — Discharge Instructions (Signed)

## 2013-12-19 LAB — CULTURE, GROUP A STREP

## 2013-12-19 NOTE — Progress Notes (Signed)
Quick Note:  Results are abnormal as noted, but have been adequately treated. No further action necessary. The patient was treated with amoxicillin. ______ 

## 2013-12-20 ENCOUNTER — Encounter: Payer: Self-pay | Admitting: Obstetrics & Gynecology

## 2013-12-20 ENCOUNTER — Telehealth (HOSPITAL_COMMUNITY): Payer: Self-pay | Admitting: *Deleted

## 2013-12-20 ENCOUNTER — Ambulatory Visit (INDEPENDENT_AMBULATORY_CARE_PROVIDER_SITE_OTHER): Payer: Medicaid Other | Admitting: Obstetrics & Gynecology

## 2013-12-20 VITALS — BP 107/76 | HR 66 | Ht 66.0 in | Wt 246.0 lb

## 2013-12-20 DIAGNOSIS — R102 Pelvic and perineal pain: Secondary | ICD-10-CM | POA: Diagnosis not present

## 2013-12-20 NOTE — Progress Notes (Signed)
   Subjective:    Patient ID: Jasmine Cook, female    DOB: 11/14/1985, 28 y.o.   MRN: [id number]  HPI This 28 yo AA P2 is here for follow up [facility]. [facility] her normal care there and got her second Mirena placed there in April of 2015. She [facility] complaining of QOD pelvic pain for several months. They ordered an u/s. The u/s showed the IUD to be in an appropriate position as well as 2 small right ovarian cysts. She tells me that her pain has since resolved. She denies dyspareunia or other problems.   Review of Systems She reports a normal pap smear [facility] the [facility]    Objective:   Physical Exam  abd- morbidly obese, benign      Assessment & Plan:  Pelvic pain resolved RTC prn She declines a flu vaccine.

## 2013-12-20 NOTE — ED Notes (Signed)
Throat culture: Strep beta hemolytic not group A.  Pt. adequately treated with Amoxicillin.  I called pt.  Pt. verified x 2 and given results. Pt. told she was adequately treated and she said she is much better. Instructed to finish all of medication and notify anyone she exposed if they get the same symptoms. Pt. told to come back if not better after the medication. Pt. voiced understanding. Vassie MoselleYork, Jasmine Cook M 12/20/2013

## 2013-12-26 ENCOUNTER — Encounter: Payer: Self-pay | Admitting: Obstetrics & Gynecology

## 2014-04-11 ENCOUNTER — Ambulatory Visit: Payer: Medicaid Other | Admitting: Obstetrics & Gynecology

## 2014-04-20 ENCOUNTER — Ambulatory Visit (INDEPENDENT_AMBULATORY_CARE_PROVIDER_SITE_OTHER): Payer: Medicaid Other | Admitting: Obstetrics & Gynecology

## 2014-04-20 ENCOUNTER — Encounter: Payer: Self-pay | Admitting: Obstetrics & Gynecology

## 2014-04-20 VITALS — BP 120/81 | HR 70 | Wt 251.0 lb

## 2014-04-20 DIAGNOSIS — N8320 Unspecified ovarian cysts: Secondary | ICD-10-CM | POA: Diagnosis not present

## 2014-04-20 DIAGNOSIS — N83201 Unspecified ovarian cyst, right side: Secondary | ICD-10-CM

## 2014-04-20 NOTE — Progress Notes (Signed)
Subjective:     Patient ID: Jasmine Cook, female   DOB: 03/04/1985, 29 y.o.   MRN: [id number]  HPI Pt reports continued pain in her right side. Prev dx'd with ov cyst.  She takes Motrin prn.  The pain does not limit her daily activities and is improved with Motrin.  Pain is worse in the am.      Past Medical History  Diagnosis Date  . Migraines   History reviewed. No pertinent past surgical history. History   Social History  . Marital Status: Single    Spouse Name: N/A  . Number of Children: N/A  . Years of Education: N/A   Occupational History  . Not on file.   Social History Main Topics  . Smoking status: Never Smoker   . Smokeless tobacco: Not on file  . Alcohol Use: No  . Drug Use: No  . Sexual Activity: Yes    Birth Control/ Protection: IUD   Other Topics Concern  . Not on file   Social History Narrative   Family History  Problem Relation Age of Onset  . Other Father     brain aneursym      Review of Systems     Objective:   Physical Exam BP 120/81 mmHg  Pulse 70  Wt 251 lb (113.853 kg)  LMP 04/18/2014 Pt in NAD Abd: obese, NT, ND GU: EGBUS: no lesions Vagina: no blood in vault Cervix: no lesion; no mucopurulent d/c Uterus: small, mobile Adnexa: no masses; non tender     12/01/2013 CLINICAL DATA: Patient with pelvic pain.  EXAM: TRANSABDOMINAL AND TRANSVAGINAL ULTRASOUND OF PELVIS  TECHNIQUE: Both transabdominal and transvaginal ultrasound examinations of the pelvis were performed. Transabdominal technique was performed for global imaging of the pelvis including uterus, ovaries, adnexal regions, and pelvic cul-de-sac. It was necessary to proceed with endovaginal exam following the transabdominal exam to visualize the adnexal structures.  COMPARISON: None  FINDINGS: Uterus  Measurements: 10.3 x 5.1 x 5.9 cm. Along the posterior aspect of the uterine body there is a 1.5 x 1.1 x 1.2 cm fibroid.  Endometrium  Thickness: 8  mm. No focal abnormality visualized.  Right ovary  Measurements: 5.0 x 4.2 x 4.6 cm. The right ovary is enlarged and contains 2 adjacent simple cyst measuring 3.1 x 3.2 x 3.3 cm and 3.5 x 3.7 x 4.8 cm.  Left ovary  Measurements: 4.2 x 1.9 x 1.8 cm. Normal appearance/no adnexal mass.  Other findings  No free fluid.  IMPRESSION: The right ovary is enlarged and contains 2 adjacent simple cysts. Given the size of the ovary containing the cysts, intermittent torsion cannot be entirely excluded.      Assessment:     right ov cyst     Plan:     Pelvic (TV) sono Motrin prn F/u in 3 months or sooner prn

## 2014-04-20 NOTE — Patient Instructions (Signed)
Ovarian Cyst An ovarian cyst is a fluid-filled sac that forms on an ovary. The ovaries are small organs that produce eggs in women. Various types of cysts can form on the ovaries. Most are not cancerous. Many do not cause problems, and they often go away on their own. Some may cause symptoms and require treatment. Common types of ovarian cysts include:  Functional cysts--These cysts may occur every month during the menstrual cycle. This is normal. The cysts usually go away with the next menstrual cycle if the woman does not get pregnant. Usually, there are no symptoms with a functional cyst.  Endometrioma cysts--These cysts form from the tissue that lines the uterus. They are also called "chocolate cysts" because they become filled with blood that turns brown. This type of cyst can cause pain in the lower abdomen during intercourse and with your menstrual period.  Cystadenoma cysts--This type develops from the cells on the outside of the ovary. These cysts can get very big and cause lower abdomen pain and pain with intercourse. This type of cyst can twist on itself, cut off its blood supply, and cause severe pain. It can also easily rupture and cause a lot of pain.  Dermoid cysts--This type of cyst is sometimes found in both ovaries. These cysts may contain different kinds of body tissue, such as skin, teeth, hair, or cartilage. They usually do not cause symptoms unless they get very big.  Theca lutein cysts--These cysts occur when too much of a certain hormone (human chorionic gonadotropin) is produced and overstimulates the ovaries to produce an egg. This is most common after procedures used to assist with the conception of a baby (in vitro fertilization). CAUSES   Fertility drugs can cause a condition in which multiple large cysts are formed on the ovaries. This is called ovarian hyperstimulation syndrome.  A condition called polycystic ovary syndrome can cause hormonal imbalances that can lead to  nonfunctional ovarian cysts. SIGNS AND SYMPTOMS  Many ovarian cysts do not cause symptoms. If symptoms are present, they may include:  Pelvic pain or pressure.  Pain in the lower abdomen.  Pain during sexual intercourse.  Increasing girth (swelling) of the abdomen.  Abnormal menstrual periods.  Increasing pain with menstrual periods.  Stopping having menstrual periods without being pregnant. DIAGNOSIS  These cysts are commonly found during a routine or annual pelvic exam. Tests may be ordered to find out more about the cyst. These tests may include:  Ultrasound.  X-ray of the pelvis.  CT scan.  MRI.  Blood tests. TREATMENT  Many ovarian cysts go away on their own without treatment. Your health care provider may want to check your cyst regularly for 2-3 months to see if it changes. For women in menopause, it is particularly important to monitor a cyst closely because of the higher rate of ovarian cancer in menopausal women. When treatment is needed, it may include any of the following:  A procedure to drain the cyst (aspiration). This may be done using a long needle and ultrasound. It can also be done through a laparoscopic procedure. This involves using a thin, lighted tube with a tiny camera on the end (laparoscope) inserted through a small incision.  Surgery to remove the whole cyst. This may be done using laparoscopic surgery or an open surgery involving a larger incision in the lower abdomen.  Hormone treatment or birth control pills. These methods are sometimes used to help dissolve a cyst. HOME CARE INSTRUCTIONS   Only take over-the-counter   or prescription medicines as directed by your health care provider.  Follow up with your health care provider as directed.  Get regular pelvic exams and Pap tests. SEEK MEDICAL CARE IF:   Your periods are late, irregular, or painful, or they stop.  Your pelvic pain or abdominal pain does not go away.  Your abdomen becomes  larger or swollen.  You have pressure on your bladder or trouble emptying your bladder completely.  You have pain during sexual intercourse.  You have feelings of fullness, pressure, or discomfort in your stomach.  You lose weight for no apparent reason.  You feel generally ill.  You become constipated.  You lose your appetite.  You develop acne.  You have an increase in body and facial hair.  You are gaining weight, without changing your exercise and eating habits.  You think you are pregnant. SEEK IMMEDIATE MEDICAL CARE IF:   You have increasing abdominal pain.  You feel sick to your stomach (nauseous), and you throw up (vomit).  You develop a fever that comes on suddenly.  You have abdominal pain during a bowel movement.  Your menstrual periods become heavier than usual. MAKE SURE YOU:  Understand these instructions.  Will watch your condition.  Will get help right away if you are not doing well or get worse. Document Released: 02/10/2005 Document Revised: 02/15/2013 Document Reviewed: 10/18/2012 [employer] Patient Information 2015 [employer]. This information is not intended to replace advice given to you by your health care provider. Make sure you discuss any questions you have with your health care provider.  

## 2014-04-28 ENCOUNTER — Ambulatory Visit (HOSPITAL_COMMUNITY)
Admission: RE | Admit: 2014-04-28 | Discharge: 2014-04-28 | Disposition: A | Discharge status: Home / Self Care | Payer: Medicaid Other | Source: Ambulatory Visit | Attending: Obstetrics & Gynecology | Admitting: Obstetrics & Gynecology

## 2014-04-28 ENCOUNTER — Other Ambulatory Visit: Payer: Self-pay | Admitting: Obstetrics & Gynecology

## 2014-04-28 DIAGNOSIS — N926 Irregular menstruation, unspecified: Secondary | ICD-10-CM | POA: Insufficient documentation

## 2014-04-28 DIAGNOSIS — N8320 Unspecified ovarian cysts: Secondary | ICD-10-CM | POA: Insufficient documentation

## 2014-04-28 DIAGNOSIS — N83201 Unspecified ovarian cyst, right side: Secondary | ICD-10-CM

## 2014-05-01 ENCOUNTER — Telehealth: Payer: Self-pay

## 2014-05-01 NOTE — Telephone Encounter (Signed)
-----   Message from Willodean Rosenthalarolyn Harraway-Smith, MD sent at 05/01/2014  4:44 PM EST ----- Please call pt.  Her sono show no evidence of ov cyst and IUD is in place.  Thx, clh-S

## 2014-05-01 NOTE — Telephone Encounter (Signed)
Called pt and LM to return our call to the clinics.

## 2014-05-02 NOTE — Telephone Encounter (Signed)
Called pt and informed her of US results - no ovarian cyst and IUD is in place. Pt voiced understanding and had no questions.

## 2015-10-29 IMAGING — US US TRANSVAGINAL NON-OB
1 series · 13 of 25 positions shown · non-contrast
Comparison: None

ADDENDUM:
There is an intrauterine device present within the endometrium
visualized on a few of transverse and sagittal images of the uterus.
There are no definitive images however that document the superior
aspect of the intrauterine device to be located within the uterine
fundus. As the study is currently inclusive for the exact location
of the intrauterine device, recommend the patient be brought back
for additional sonographic evaluation of the uterus with dedicated
imaging towards location of the intrauterine device.

This was discussed with Hoikiu Karry, RN at [DATE] p.m. on 12/01/2013.
The patient may return for additional images at no additional
charge.
Additional images were obtained on 12/02/2013 to further evaluate
the intrauterine device location. An intrauterine device is
visualized on both sagittal and transverse images. The intrauterine
device appears in appropriate position.
CLINICAL DATA: Patient with pelvic pain.
EXAM:
TRANSABDOMINAL AND TRANSVAGINAL ULTRASOUND OF PELVIS
TECHNIQUE: Both transabdominal and transvaginal ultrasound examinations of the
pelvis were performed. Transabdominal technique was performed for
global imaging of the pelvis including uterus, ovaries, adnexal
regions, and pelvic cul-de-sac. It was necessary to proceed with
endovaginal exam following the transabdominal exam to visualize the
adnexal structures.

[Series 1: us transvaginal non-ob · 0.21mm/px · 13 of 81 slices shown]
[im 1/81]
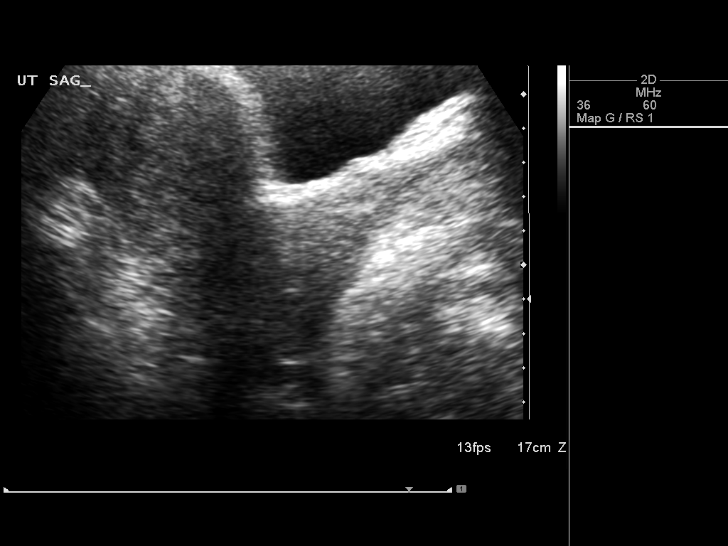
[im 7/81]
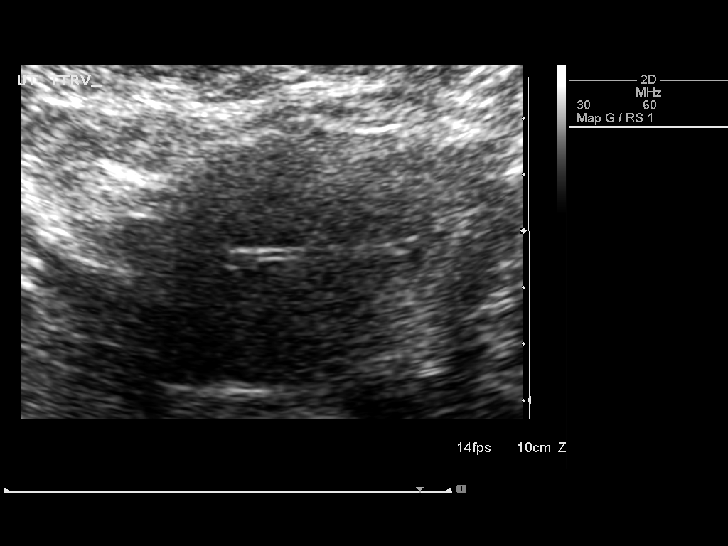
[im 14/81]
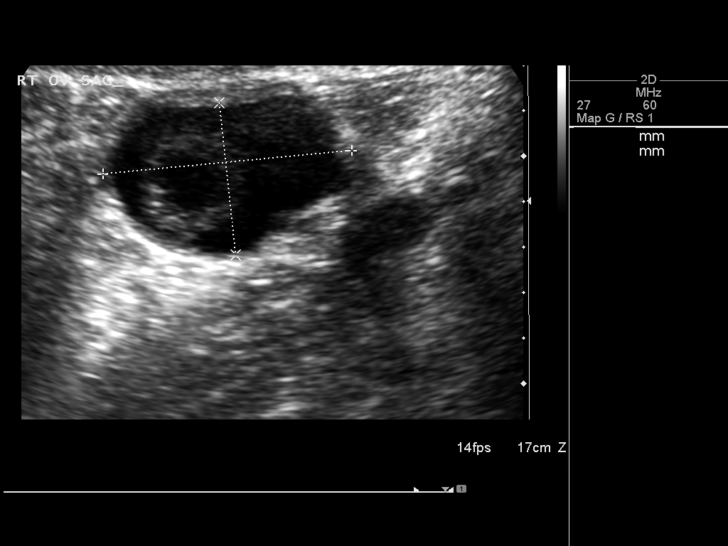
[im 21/81]
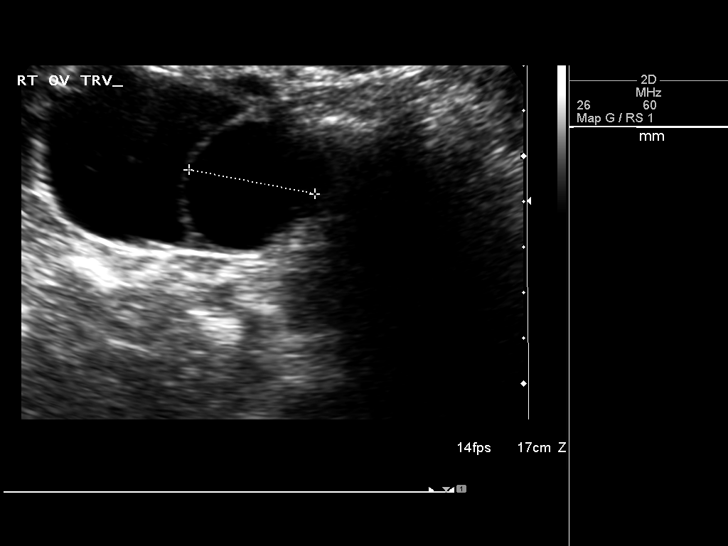
[im 27/81]
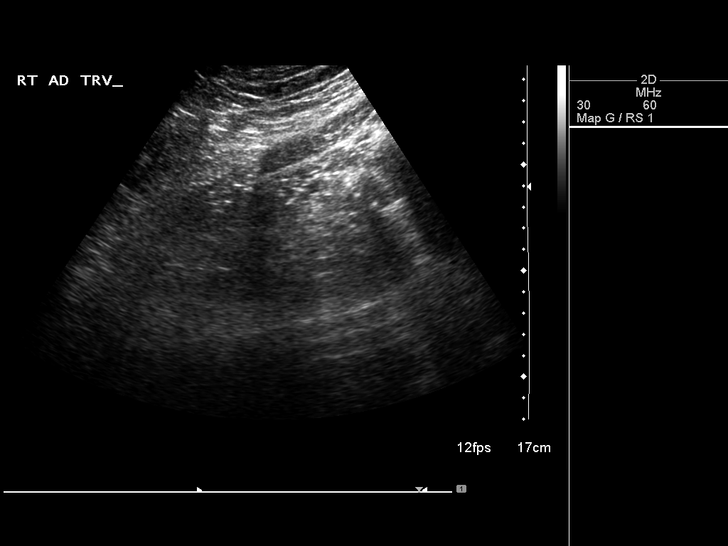
[im 34/81]
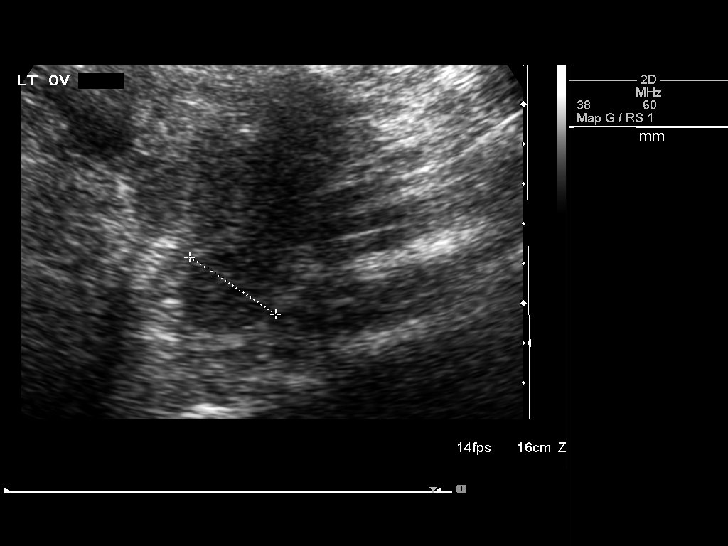
[im 41/81]
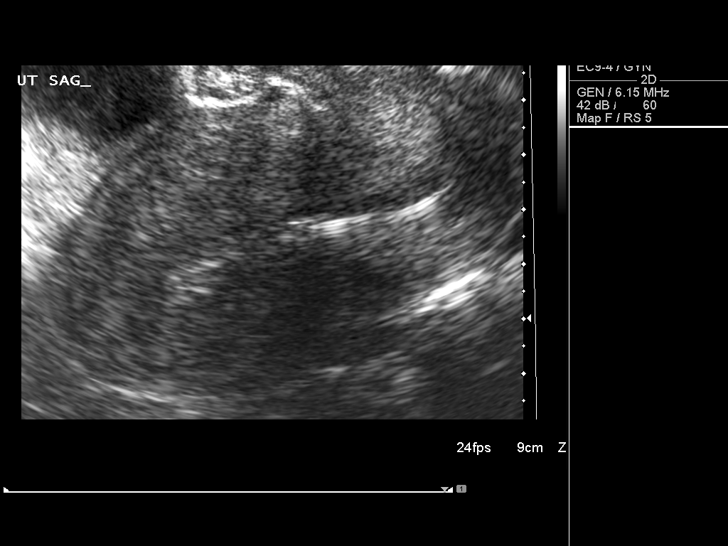
[im 47/81]
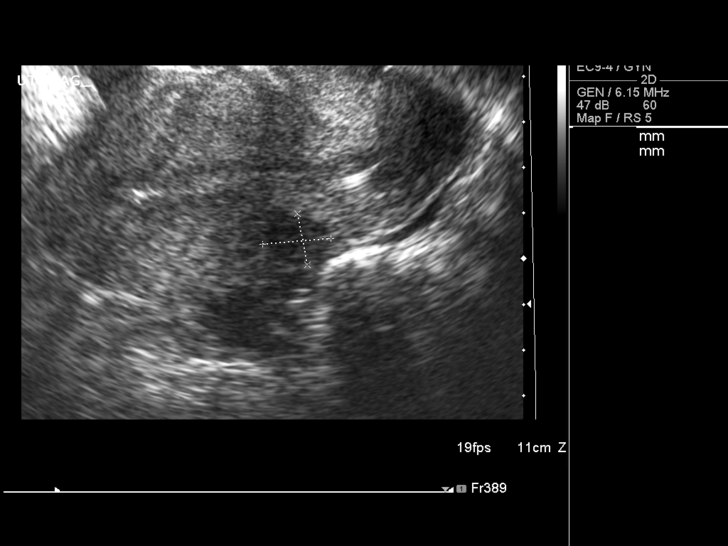
[im 54/81]
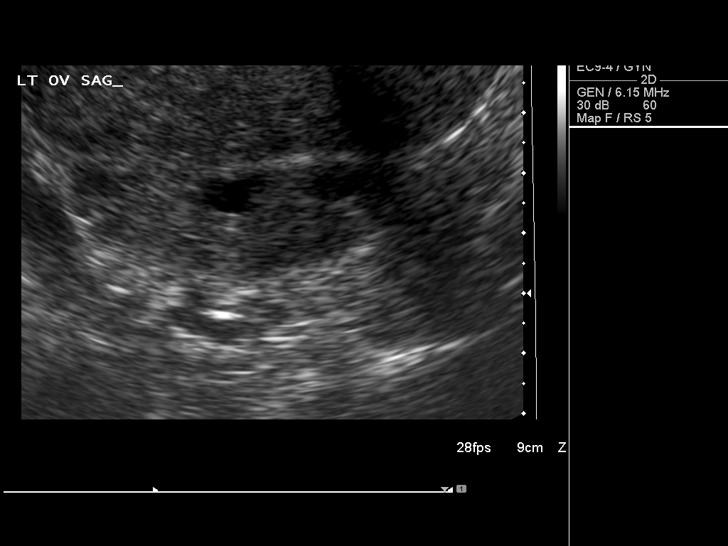
[im 61/81]
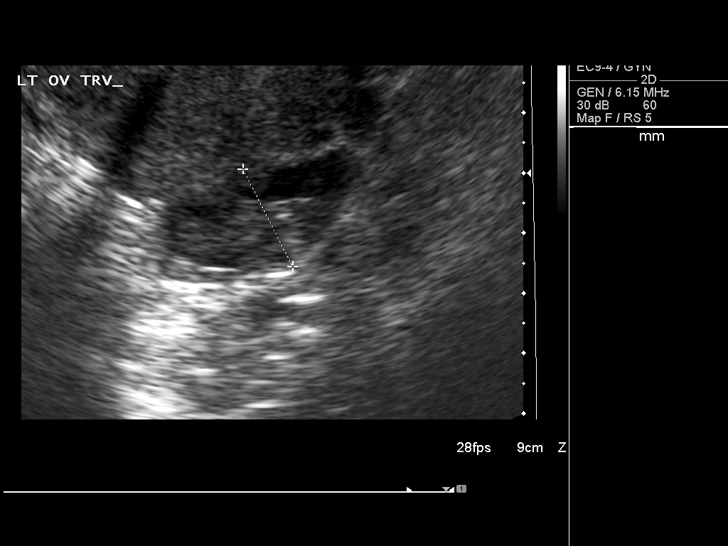
[im 67/81]
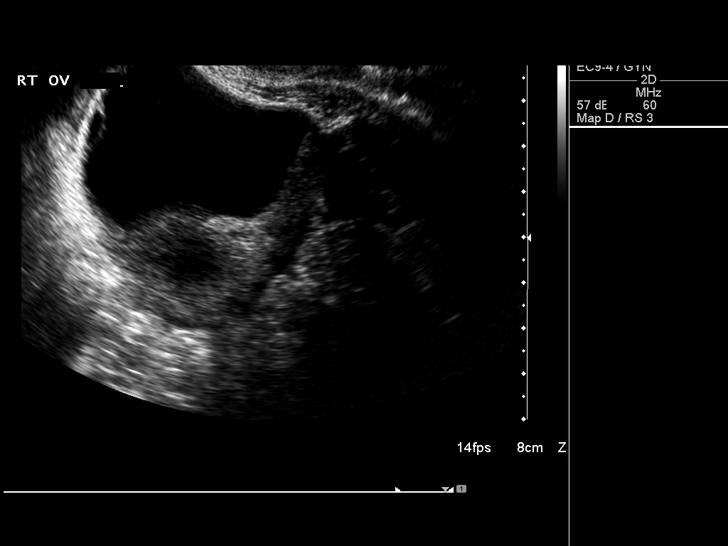
[im 74/81]
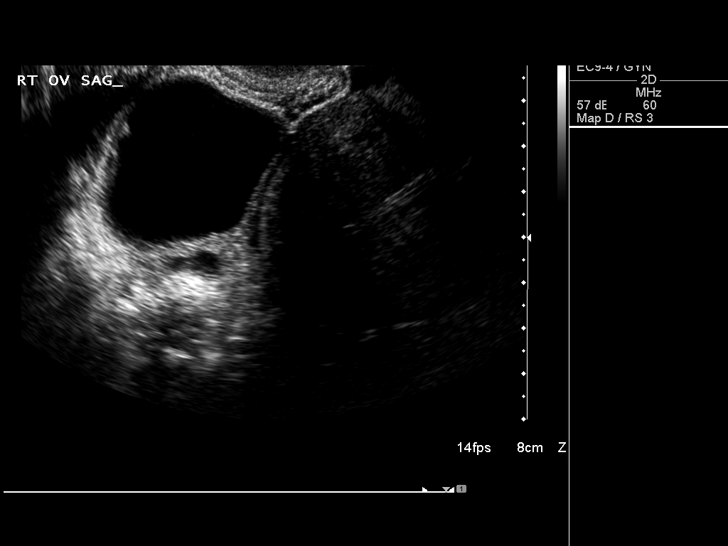
[im 81/81]
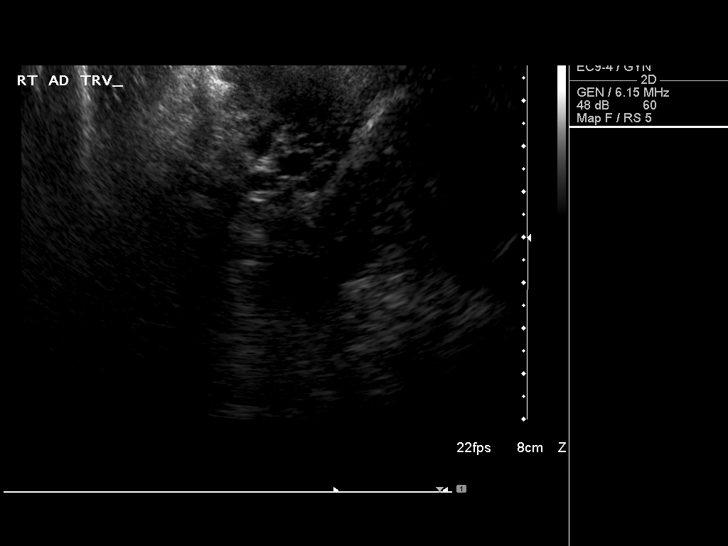

[13 of 25 positions shown; findings below may reference images not displayed]

FINDINGS: Uterus

Measurements: 10.3 x 5.1 x 5.9 cm. Along the posterior aspect of the
uterine body there is a 1.5 x 1.1 x 1.2 cm fibroid.

Endometrium

Thickness: 8 mm.  No focal abnormality visualized.

Right ovary

Measurements: 5.0 x 4.2 x 4.6 cm. The right ovary is enlarged and
contains 2 adjacent simple cyst measuring 3.1 x 3.2 x 3.3 cm and
x 3.7 x 4.8 cm.

Left ovary

Measurements: 4.2 x 1.9 x 1.8 cm. Normal appearance/no adnexal mass.

Other findings

No free fluid.
IMPRESSION: The right ovary is enlarged and contains 2 adjacent simple cysts.
Given the size of the ovary containing the cysts, intermittent
torsion cannot be entirely excluded.

These results will be called to the ordering clinician or
representative by the Radiologist Assistant, and communication
documented in the PACS or zVision Dashboard.

## 2016-03-25 IMAGING — US US PELVIS COMPLETE
1 series · 14 of 25 positions shown · non-contrast
Comparison: 12/01/2013

CLINICAL DATA: Irregular periods, IUD placement, follow-up right
ovarian cyst

EXAM:
TRANSABDOMINAL AND TRANSVAGINAL ULTRASOUND OF PELVIS
TECHNIQUE: Both transabdominal and transvaginal ultrasound examinations of the
pelvis were performed. Transabdominal technique was performed for
global imaging of the pelvis including uterus, ovaries, adnexal
regions, and pelvic cul-de-sac. It was necessary to proceed with
endovaginal exam following the transabdominal exam to visualize the
endometrium and left ovary.

[Series 1: us transvaginal non-ob · 14 of 60 slices shown]
[im 1/60]
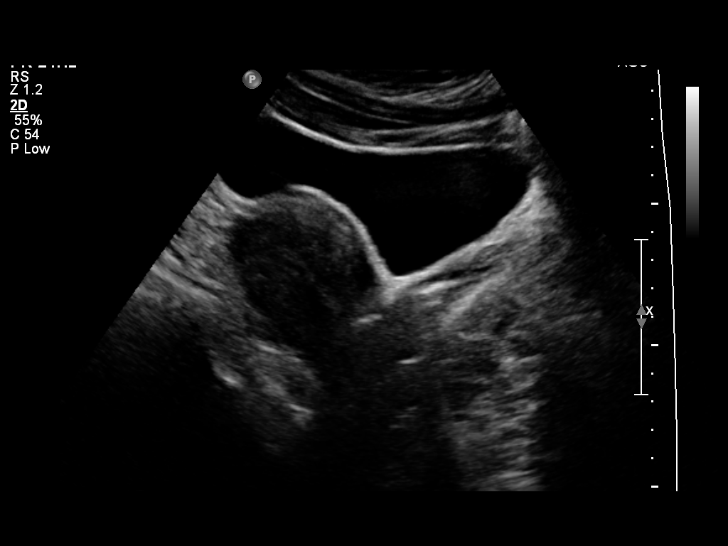
[im 5/60]
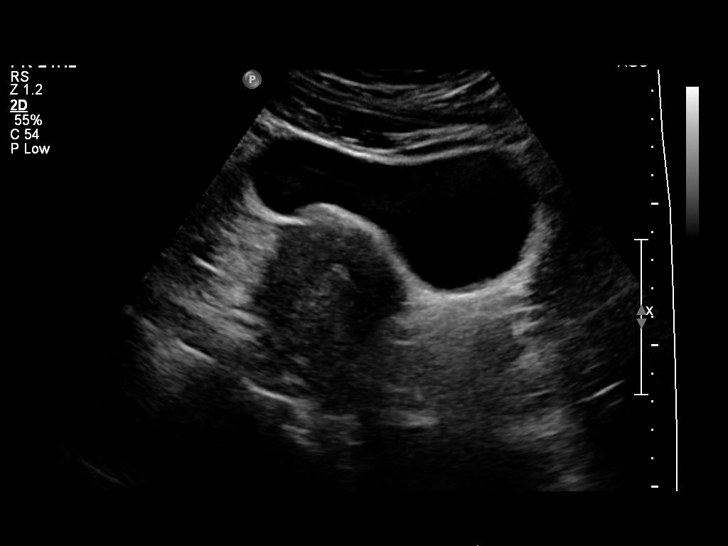
[im 10/60]
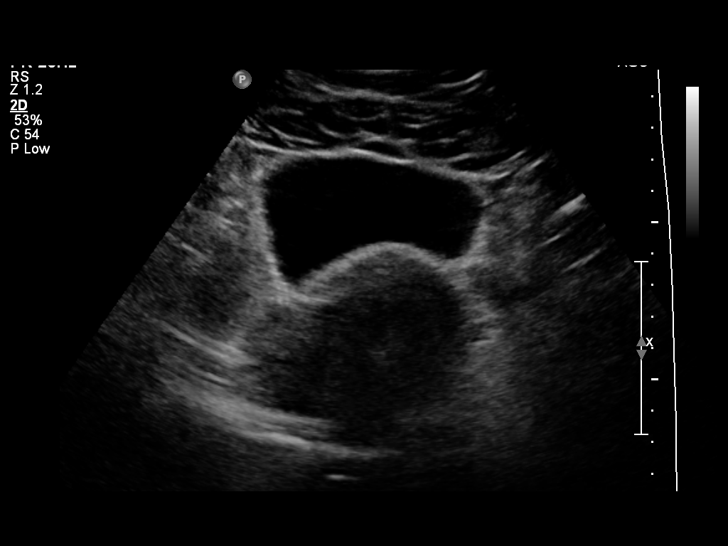
[im 15/60]
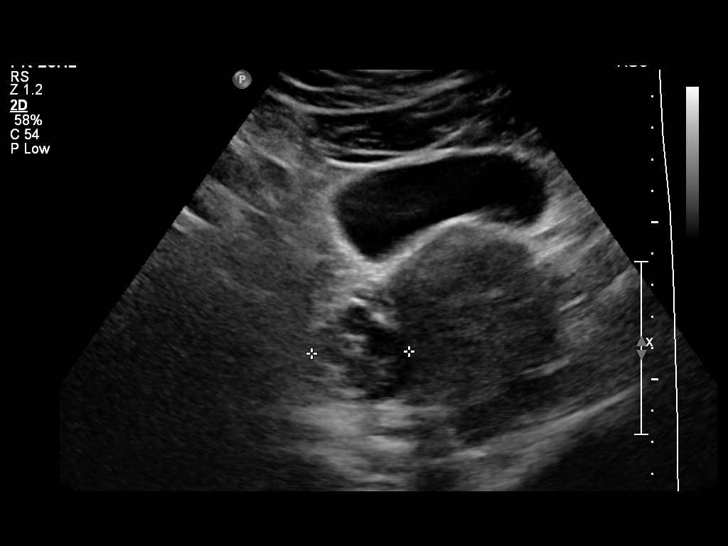
[im 20/60]
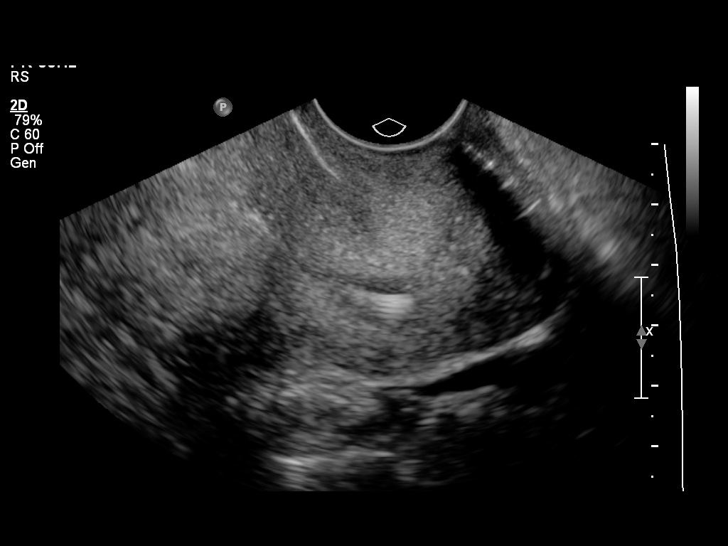
[im 23/60]
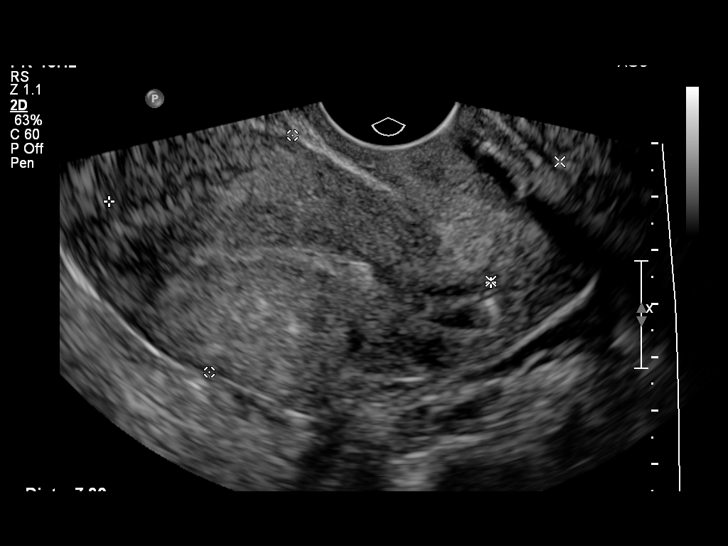
[im 28/60]
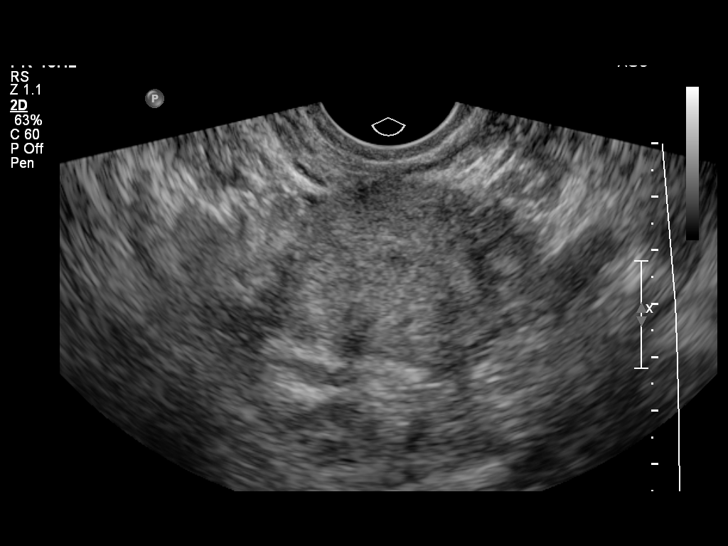
[im 32/60]
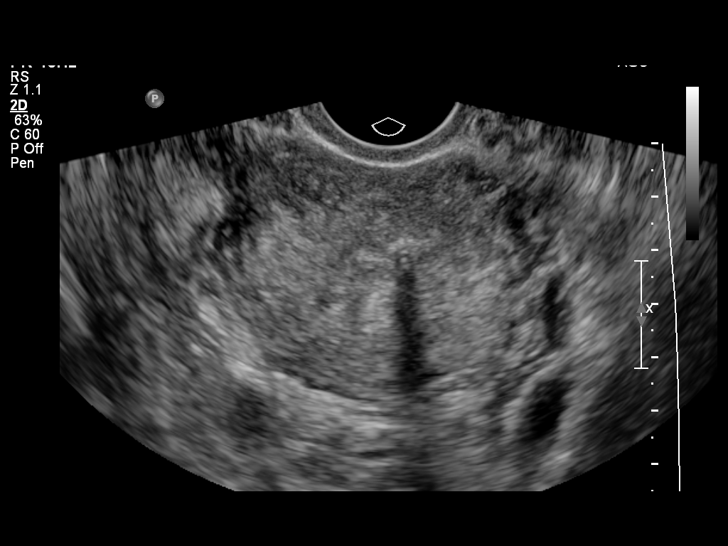
[im 37/60]
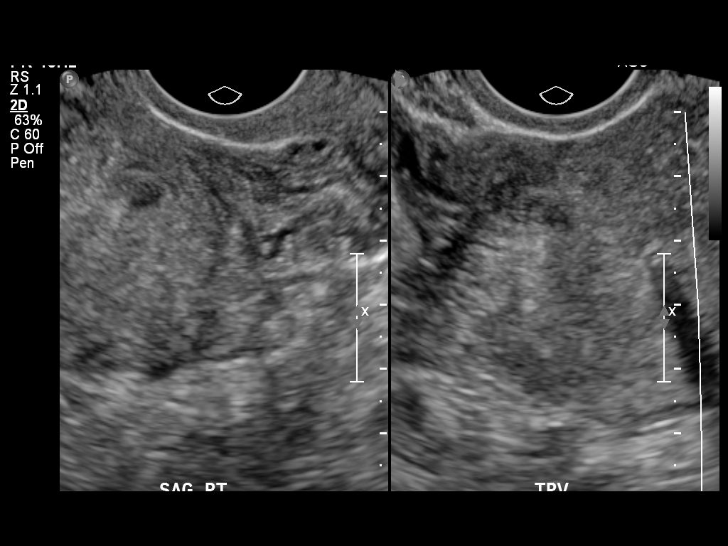
[im 40/60]
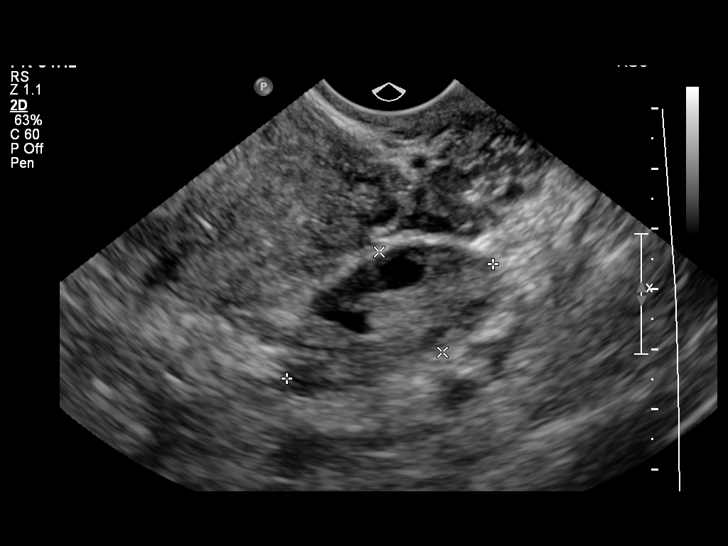
[im 45/60]
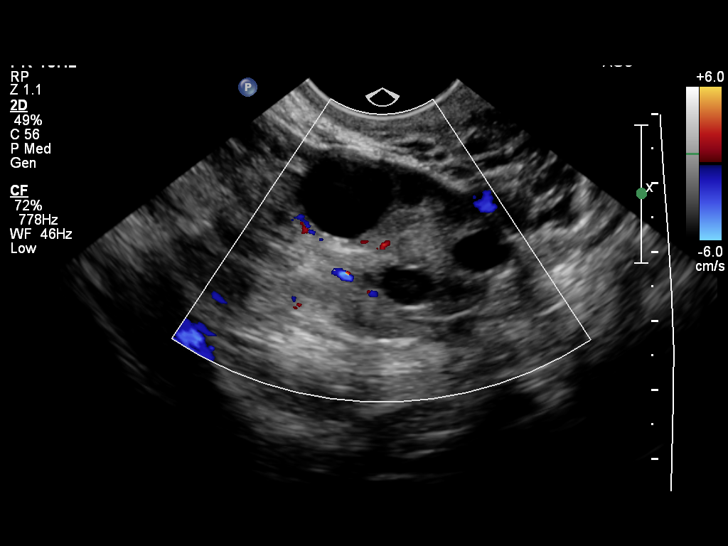
[im 50/60]
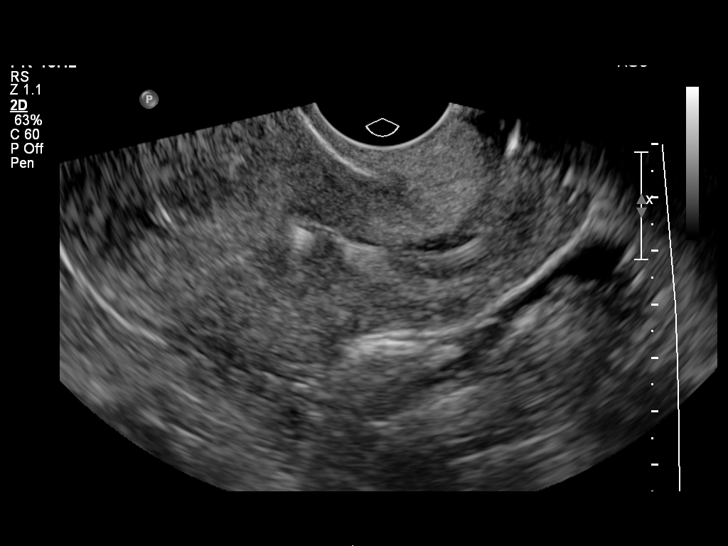
[im 55/60]
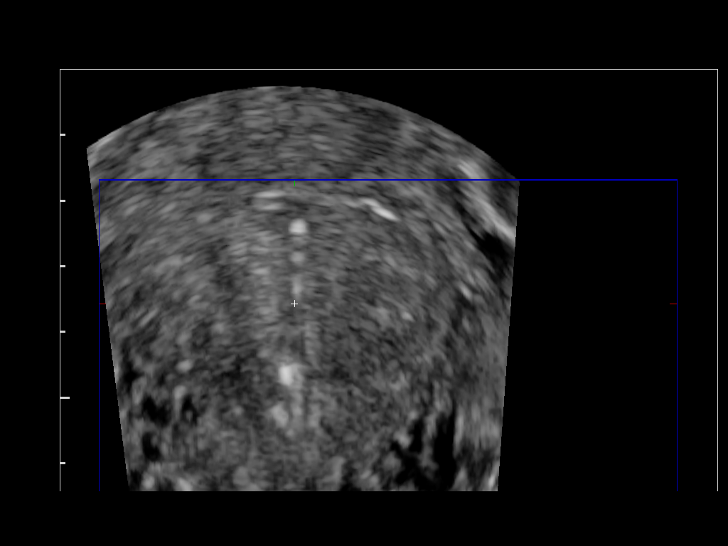
[im 60/60]
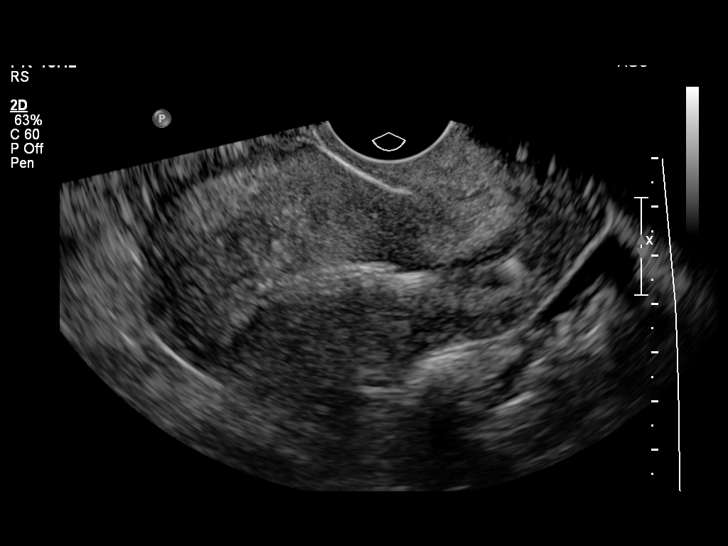

[14 of 25 positions shown; findings below may reference images not displayed]

FINDINGS: Uterus

Measurements: 9.9 x 4.7 x 6.4 cm. 9 x 7 x 5 mm intramural fibroid in
the right fundus.

Endometrium

Thickness: 6 mm.  IUD in satisfactory position.

Right ovary

Measurements: 3.8 x 2.5 x 3.3 cm. Normal appearance/no adnexal mass.

Left ovary

Measurements: 3.3 x 2.0 x 2.5 cm. Normal appearance/no adnexal mass.

Other findings

No free fluid.
IMPRESSION: IUD in satisfactory position.

9 mm intramural fibroid in the right uterine fundus.
